# Patient Record
Sex: Male | Born: 1975 | Race: White | Hispanic: No | Marital: Married | Smoking: Never smoker
Health system: Southern US, Community
[De-identification: ages and names within clinical notes are randomized; demographics above are authoritative.]

## PROBLEM LIST (undated history)

## (undated) DIAGNOSIS — R002 Palpitations: Secondary | ICD-10-CM

## (undated) DIAGNOSIS — I493 Ventricular premature depolarization: Secondary | ICD-10-CM

## (undated) DIAGNOSIS — T7840XA Allergy, unspecified, initial encounter: Secondary | ICD-10-CM

## (undated) DIAGNOSIS — E785 Hyperlipidemia, unspecified: Secondary | ICD-10-CM

## (undated) DIAGNOSIS — Z8619 Personal history of other infectious and parasitic diseases: Secondary | ICD-10-CM

## (undated) DIAGNOSIS — B351 Tinea unguium: Secondary | ICD-10-CM

## (undated) DIAGNOSIS — L309 Dermatitis, unspecified: Secondary | ICD-10-CM

## (undated) DIAGNOSIS — E559 Vitamin D deficiency, unspecified: Secondary | ICD-10-CM

## (undated) HISTORY — PX: OTHER SURGICAL HISTORY: SHX169

## (undated) HISTORY — DX: Vitamin D deficiency, unspecified: E55.9

## (undated) HISTORY — DX: Tinea unguium: B35.1

## (undated) HISTORY — DX: Dermatitis, unspecified: L30.9

## (undated) HISTORY — PX: TONSILLECTOMY: SHX5217

## (undated) HISTORY — DX: Hyperlipidemia, unspecified: E78.5

## (undated) HISTORY — DX: Personal history of other infectious and parasitic diseases: Z86.19

## (undated) HISTORY — DX: Ventricular premature depolarization: I49.3

## (undated) HISTORY — DX: Allergy, unspecified, initial encounter: T78.40XA

## (undated) HISTORY — DX: Palpitations: R00.2

---

## 2013-10-13 DIAGNOSIS — R002 Palpitations: Secondary | ICD-10-CM

## 2013-10-13 HISTORY — DX: Palpitations: R00.2

## 2015-08-10 ENCOUNTER — Ambulatory Visit (INDEPENDENT_AMBULATORY_CARE_PROVIDER_SITE_OTHER)
Admission: RE | Admit: 2015-08-10 | Discharge: 2015-08-10 | Disposition: A | Discharge status: Home / Self Care | Payer: 59 | Source: Ambulatory Visit | Attending: Internal Medicine | Admitting: Internal Medicine

## 2015-08-10 ENCOUNTER — Other Ambulatory Visit: Payer: Self-pay | Admitting: Internal Medicine

## 2015-08-10 ENCOUNTER — Encounter: Payer: Self-pay | Admitting: Internal Medicine

## 2015-08-10 DIAGNOSIS — Z9181 History of falling: Secondary | ICD-10-CM

## 2015-08-10 DIAGNOSIS — M25571 Pain in right ankle and joints of right foot: Secondary | ICD-10-CM

## 2015-08-10 IMAGING — DX DG ANKLE COMPLETE 3+V*R*
3 series · 3 of 3 positions shown · non-contrast
Comparison: None.

CLINICAL DATA: Rolled right ankle on [REDACTED] with tenderness.
Initial encounter.

EXAM:
RIGHT ANKLE - COMPLETE 3+ VIEW

[ankle ap]
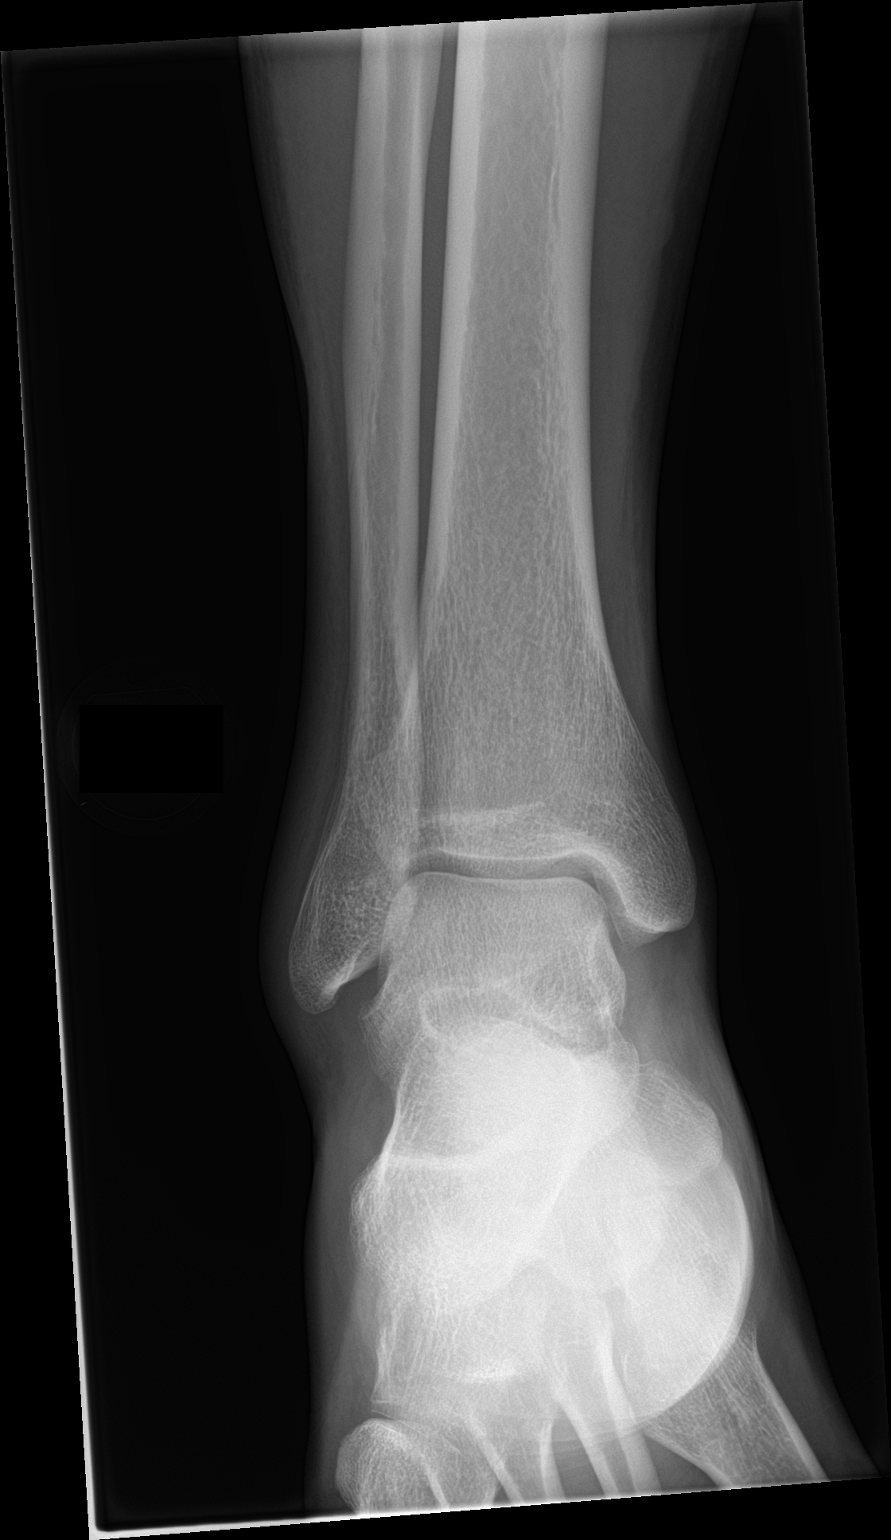

[ankle obl]
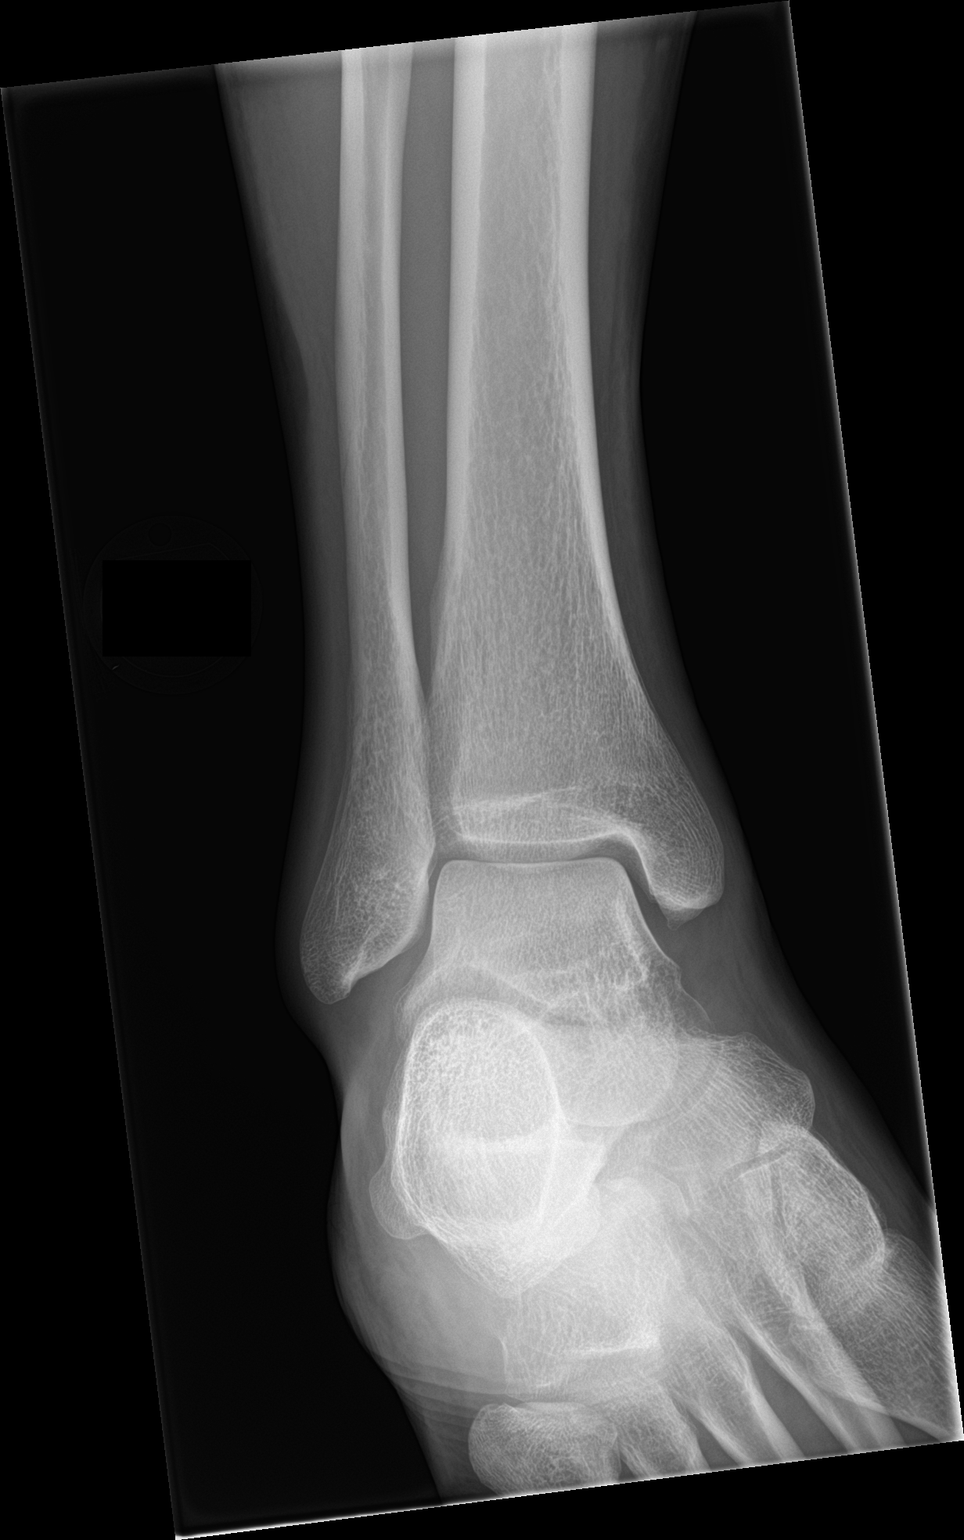

[ankle lat]
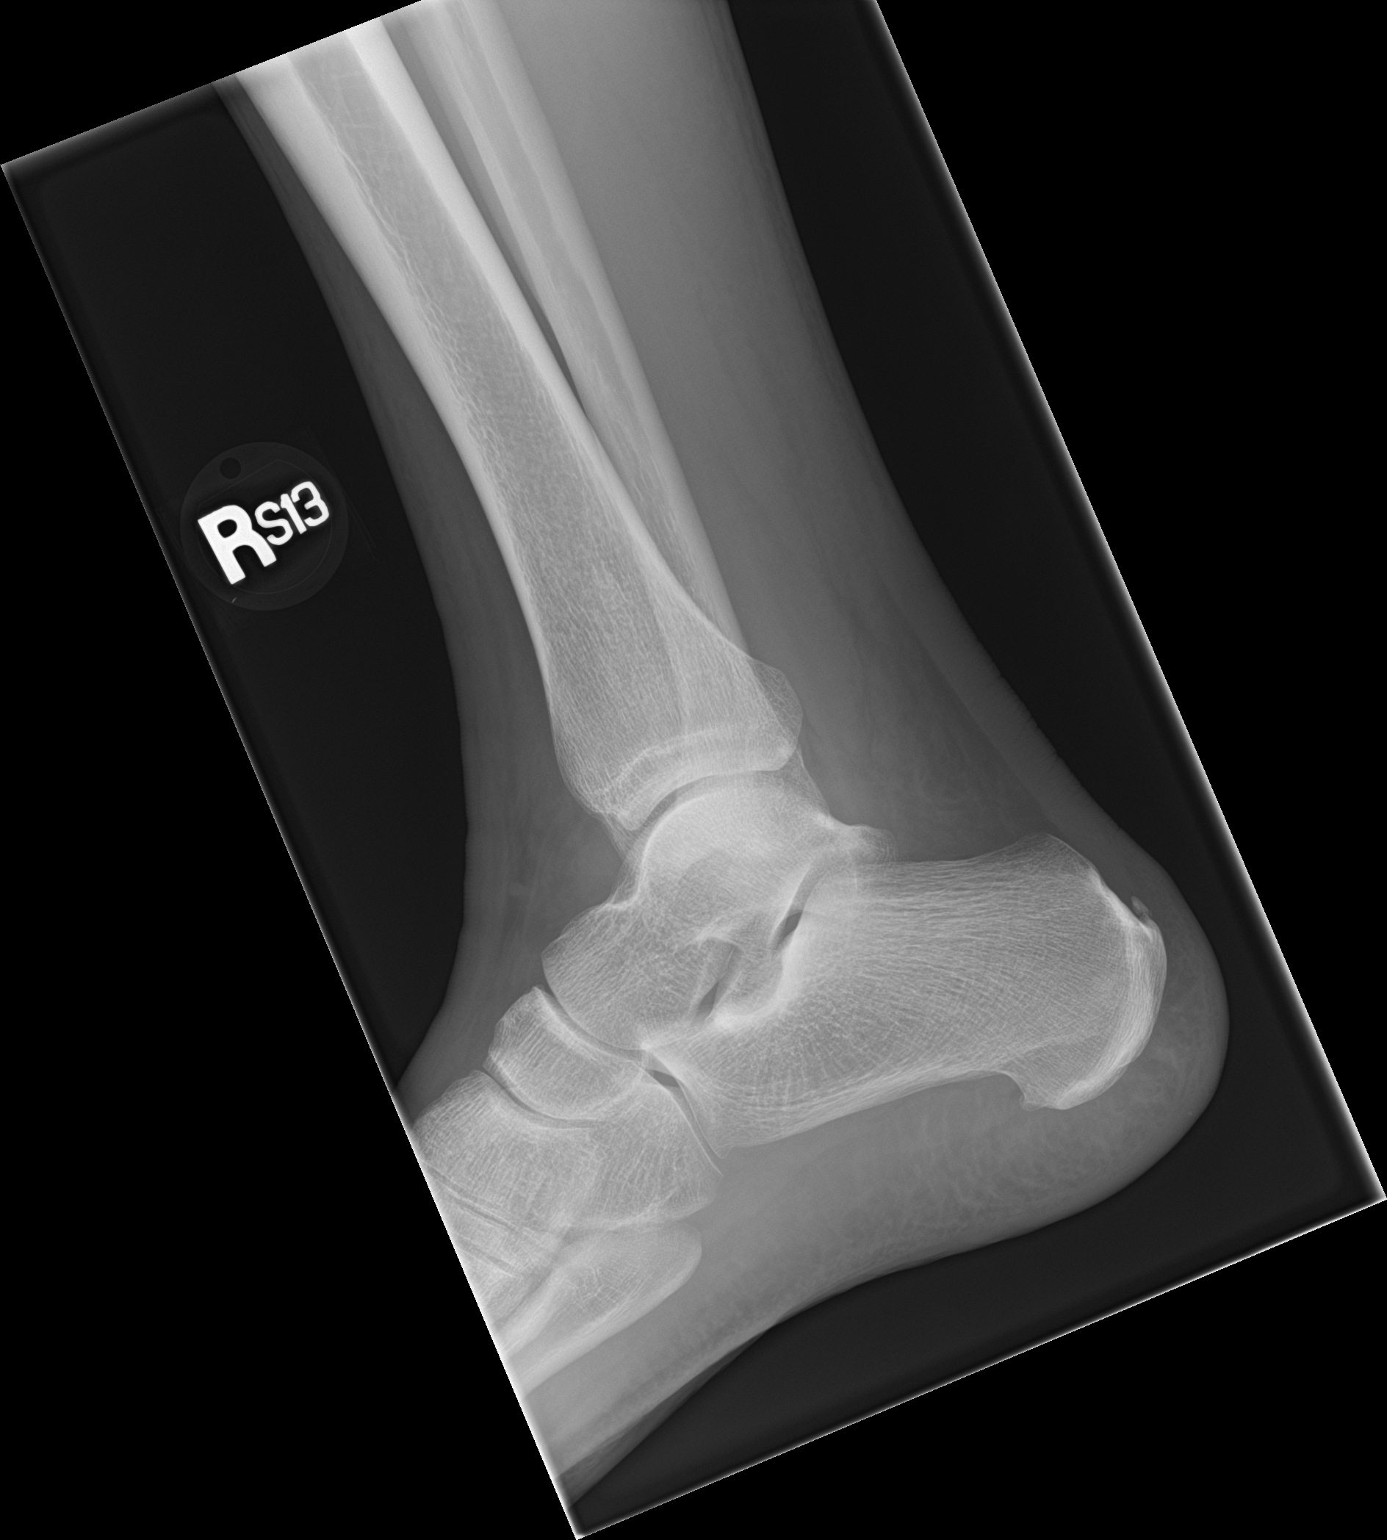

[3 of 3 positions shown; findings below may reference images not displayed]

FINDINGS: Mild periarticular swelling with possible small ankle joint
effusion. No fracture or malalignment.

Small calcaneal spurs.
IMPRESSION: Soft tissue swelling without fracture.

## 2015-08-10 NOTE — Progress Notes (Signed)
Per Dr Vena Rua verbal orders, he would like patient to have right ankle x rays to rule out fracture due to recent fall.

## 2015-08-13 ENCOUNTER — Encounter: Payer: Self-pay | Admitting: Internal Medicine

## 2015-08-13 NOTE — Progress Notes (Signed)
Patient ID: Gary Watts, male   DOB: [birth year], 39 y.o.   MRN: [id number] 39 year old male with little past medical history Twisted ankle coming off of the step 4 days ago. Has had swelling, bruising and pain initially the pain has improved Able to ambulate, some pain with extreme dorsiflexion No fevers Assessment: ankle sprain, rule out fracture Plan: complete ankle xray series, ortho if abnormality found. Otherwise, rest, ice, elevation

## 2016-01-10 DIAGNOSIS — Z0000 Encounter for general adult medical examination without abnormal findings: Secondary | ICD-10-CM | POA: Diagnosis not present

## 2016-01-10 DIAGNOSIS — Z1389 Encounter for screening for other disorder: Secondary | ICD-10-CM | POA: Diagnosis not present

## 2016-01-10 DIAGNOSIS — Z6824 Body mass index (BMI) 24.0-24.9, adult: Secondary | ICD-10-CM | POA: Diagnosis not present

## 2016-01-10 DIAGNOSIS — I493 Ventricular premature depolarization: Secondary | ICD-10-CM | POA: Diagnosis not present

## 2016-01-10 DIAGNOSIS — Z23 Encounter for immunization: Secondary | ICD-10-CM | POA: Diagnosis not present

## 2016-11-03 MED FILL — CARTIA XT 120 MG CAPSULE SA: 120 | 90 days supply | Qty: 90 | Fill #0

## 2017-05-25 DIAGNOSIS — Z0000 Encounter for general adult medical examination without abnormal findings: Secondary | ICD-10-CM | POA: Diagnosis not present

## 2017-05-25 DIAGNOSIS — I493 Ventricular premature depolarization: Secondary | ICD-10-CM | POA: Diagnosis not present

## 2017-05-25 DIAGNOSIS — E559 Vitamin D deficiency, unspecified: Secondary | ICD-10-CM | POA: Diagnosis not present

## 2017-05-25 DIAGNOSIS — Z6822 Body mass index (BMI) 22.0-22.9, adult: Secondary | ICD-10-CM | POA: Diagnosis not present

## 2017-05-25 DIAGNOSIS — R8299 Other abnormal findings in urine: Secondary | ICD-10-CM | POA: Diagnosis not present

## 2017-05-25 DIAGNOSIS — L308 Other specified dermatitis: Secondary | ICD-10-CM | POA: Diagnosis not present

## 2017-05-25 DIAGNOSIS — N390 Urinary tract infection, site not specified: Secondary | ICD-10-CM | POA: Diagnosis not present

## 2017-05-28 ENCOUNTER — Institutional Professional Consult (permissible substitution): Payer: 59 | Admitting: Internal Medicine

## 2017-05-29 MED FILL — TRIAMCINOLONE 0.1% CREAM: 0.1 | 20 days supply | Qty: 80 | Fill #0

## 2017-05-29 MED FILL — CARTIA XT 120 MG CAPSULE SA: 120 | 90 days supply | Qty: 90 | Fill #0

## 2017-06-25 ENCOUNTER — Ambulatory Visit (INDEPENDENT_AMBULATORY_CARE_PROVIDER_SITE_OTHER): Payer: 59 | Admitting: Internal Medicine

## 2017-06-25 ENCOUNTER — Encounter: Payer: Self-pay | Admitting: Internal Medicine

## 2017-06-25 VITALS — BP 112/76 | HR 77 | Ht 74.0 in | Wt 178.0 lb

## 2017-06-25 DIAGNOSIS — I493 Ventricular premature depolarization: Secondary | ICD-10-CM | POA: Diagnosis not present

## 2017-06-25 MED ORDER — DILTIAZEM HCL 30 MG PO TABS: 30.0000 mg | ORAL_TABLET | Freq: Every day | ORAL | 3 refills | Status: DC | PRN

## 2017-06-25 MED FILL — dilTIAZem HCL 30 MG TABS: 30 | 30 days supply | Qty: 30 | Fill #0

## 2017-06-25 NOTE — Progress Notes (Signed)
ELECTROPHYSIOLOGY CONSULT NOTE  Patient ID: Gary Watts, MRN: [id number], DOB/AGE: 1976-10-04 41 y.o. Admit date: (Not on file) Date of Consult: 06/25/2017  Primary Physician: Crist Infante, MD Primary Cardiologist: *new**     Gary Watts is a 41 y.o. male who is being seen today for the evaluation of PVC at the request of MPerini.    HPI Gary Watts is a 41 y.o. 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 They were quite brief. Holter monitoring demonstrated infrequent PVCs echocardiogram was normal. He was started on diltiazem which resulted in the obliteration of his PVCs with subsequent down titration of the frequency of his medication discontinuation. For some period of time they were better. Over the last months, they have recurred. They're most problematic at night.   He has noted no changes in exercise tolerance.  He has restricted his use of caffeine without benefit.   Past Medical History:  Diagnosis Date  . Eczema   . Eczema   . History of chicken pox   . Onychomycosis   . Palpitations 2015  . PVC (premature ventricular contraction)   . Vitamin D deficiency       Surgical History:  Past Surgical History:  Procedure Laterality Date  . TONSILLECTOMY    . WIDSOM TOOTH EXTRACTION       Home Meds: Prior to Admission medications   Medication Sig Start Date End Date Taking? Authorizing Provider  diltiazem (CARDIZEM) 120 MG tablet Take 120 mg by mouth daily.   Yes [provider]  triamcinolone cream (KENALOG) 0.1 % Apply 1 application topically 2 (two) times daily.   Yes [provider]  diltiazem (CARDIZEM) 30 MG tablet Take 1 tablet (30 mg total) by mouth daily as needed (PVCs). 06/25/17   Deboraha Sprang, MD    Allergies: No Known Allergies  Social History    Social History  . Marital status: Married    Spouse name: N/A  . Number of children: N/A  . Years of education: N/A   Occupational History  . Not on file.   Social History Main Topics  . Smoking status: Never Smoker  . Smokeless tobacco: Never Used  . Alcohol use No  . Drug use: No  . Sexual activity: Yes     Comment: MARRIED (JULIA)   Other Topics Concern  . Not on file   Social History Narrative  . No narrative on file     Family History  Problem Relation Age of Onset  . Healthy Mother   . Hypertension Father   . Hyperlipidemia Father   . Dementia Father   . Alzheimer's disease Father   . Kidney Stones Brother   . AAA (abdominal aortic aneurysm) Brother   . Cancer Maternal Grandmother   . Cancer - Colon Maternal Grandfather   . Kidney failure Paternal Grandmother   . Kidney failure Paternal Grandfather   . Healthy Son   . Healthy Son      ROS:  Please see the history of present illness.     All other systems reviewed and negative.    Physical Exam:  Blood pressure 112/76, pulse 77, height 6\' 2"  (1.88 m), weight 178 lb (80.7 kg), SpO2 99 %. General: Well developed, well nourished male in no acute distress. Head: Normocephalic, atraumatic, sclera non-icteric, no xanthomas, nares are  without discharge. EENT: normal  Lymph Nodes:  none Neck: Negative for carotid bruits. JVD not elevated. Back:without scoliosis kyphosis Lungs: Clear bilaterally to auscultation without wheezes, rales, or rhonchi. Breathing is unlabored. Heart: RRR with S1 S2. No  murmur . No rubs, or gallops appreciated. Abdomen: Soft, non-tender, non-distended with normoactive bowel sounds. No hepatomegaly. No rebound/guarding. No obvious abdominal masses. Msk:  Strength and tone appear normal for age. Extremities: No clubbing or cyanosis. No  edema.  Distal pedal pulses are 2+ and equal bilaterally. Skin: Warm and Dry Neuro: Alert and oriented X 3. CN III-XII intact Grossly normal sensory  and motor function . Psych:  Responds to questions appropriately with a normal affect.      Labs: Cardiac Enzymes No results for input(s): CKTOTAL, CKMB, TROPONINI in the last 72 hours. CBC No results found for: WBC, HGB, HCT, MCV, PLT PROTIME: No results for input(s): LABPROT, INR in the last 72 hours. Chemistry No results for input(s): NA, K, CL, CO2, BUN, CREATININE, CALCIUM, PROT, BILITOT, ALKPHOS, ALT, AST, GLUCOSE in the last 168 hours.  Invalid input(s): LABALBU Lipids No results found for: CHOL, HDL, LDLCALC, TRIG BNP No results found for: PROBNP Thyroid Function Tests: No results for input(s): TSH, T4TOTAL, T3FREE, THYROIDAB in the last 72 hours.  Invalid input(s): FREET3 Miscellaneous No results found for: DDIMER  Radiology/Studies:  No results found.  EKG: Sinus rhythm at 70 Intervals 19/08/36  His able medical record was reviewed. ECG was normal.  2015 Echocardiogram normal 2015 for monitor monomorphic PVCs representing 0.1%. Some were associated with compensatory pause and others were interpolated. They appear to have a left bundle branch block morphology with an inferior axis  Assessment and Plan:  PVCs  He has infrequent PVCs but they are quite disruptive. The past the diltiazem was sufficient; more recently, it has been less effective. His biggest problem now is at night. I have suggested that he try taking his diltiazem in the evening. His heart is structurally normal as best as we know electrocardiographically and echocardiographically and symptom wise. In the event that they are persistently disruptive, flecainide might be of some use. If there disruptive and bursts when necessary flecainide might be of value. The frequency based on his Holter monitor is so low I don't think catheter ablation would be prudent or likely productive. If however his symptoms persist, we can re-quantitate his PVC burden with a Holter monitor    Virl Axe

## 2017-06-25 NOTE — Patient Instructions (Addendum)
Medication Instructions:  Your physician has recommended you make the following change in your medication:   You may take diltiazem 30 mg (1 tablet) daily as needed for PVCs   Labwork: None ordered  Testing/Procedures: Your physician has requested that you have an echocardiogram. Echocardiography is a painless test that uses sound waves to create images of your heart. It provides your doctor with information about the size and shape of your heart and how well your heart's chambers and valves are working. This procedure takes approximately one hour. There are no restrictions for this procedure.  Follow-Up: Your physician wants you to follow-up in: 1 year with Dr. Caryl Comes. You will receive a reminder letter in the mail two months in advance. If you don't receive a letter, please call our office to schedule the follow-up appointment.   Any Other Special Instructions Will Be Listed Below (If Applicable).     If you need a refill on your cardiac medications before your next appointment, please call your pharmacy.

## 2017-07-02 ENCOUNTER — Ambulatory Visit (HOSPITAL_COMMUNITY): Payer: 59 | Attending: Cardiology

## 2017-07-02 ENCOUNTER — Other Ambulatory Visit: Payer: Self-pay

## 2017-07-02 DIAGNOSIS — I493 Ventricular premature depolarization: Secondary | ICD-10-CM | POA: Diagnosis not present

## 2017-07-02 DIAGNOSIS — R002 Palpitations: Secondary | ICD-10-CM | POA: Diagnosis not present

## 2017-07-02 LAB — ECHOCARDIOGRAM COMPLETE
E decel time: 278 msec
E/e' ratio: 4.71
FS: 30 % (ref 28–44)
IVS/LV PW RATIO, ED: 1.35
LA ID, A-P, ES: 35 mm
LA diam end sys: 35 mm
LA diam index: 1.69 cm/m2
LA vol A4C: 35 ml
LA vol index: 25.6 mL/m2
LA vol: 53 mL
LV E/e' medial: 4.71
LV E/e'average: 4.71
LV PW d: 8.38 mm — AB (ref 0.6–1.1)
LV e' LATERAL: 17.3 cm/s
LVOT area: 4.15 cm2
LVOT diameter: 23 mm
MV Dec: 278
MV Peak grad: 3 mmHg
MV pk A vel: 42 m/s
MV pk E vel: 81.4 m/s
PV Reg vel dias: 67.9 cm/s
Reg peak vel: 218 cm/s
TDI e' lateral: 17.3
TDI e' medial: 11.6
TR max vel: 218 cm/s

## 2017-07-09 ENCOUNTER — Telehealth: Payer: Self-pay

## 2017-07-09 NOTE — Telephone Encounter (Signed)
lmtcb for echo results.

## 2017-07-13 ENCOUNTER — Telehealth: Payer: Self-pay

## 2017-07-13 NOTE — Telephone Encounter (Signed)
Pt is aware of normal echo results. He states his PVC's have been better and he is feeling them less and less. He stated that Dr. Caryl Comes spoke to him about wearing a holter monitor if they aren't better but he is not at that point yet so he will wait and let us know in a couple months. We are both agreeable to plan. He was very grateful for the call.

## 2017-09-16 MED FILL — CARTIA XT 120 MG CAPSULE SA: 120 | 90 days supply | Qty: 90 | Fill #1

## 2018-01-19 MED FILL — DILTIAZEM 24HR ER 120 MG CA: 120 | 90 days supply | Qty: 90 | Fill #2

## 2018-06-15 ENCOUNTER — Other Ambulatory Visit: Payer: Self-pay | Admitting: Nurse Practitioner

## 2018-06-15 MED ORDER — CIPROFLOXACIN-HYDROCORTISONE 0.2-1 % OT SUSP: 3.0000 [drp] | Freq: Two times a day (BID) | OTIC | 0 refills | Status: DC

## 2018-06-15 MED ORDER — CIPROFLOXACIN-DEXAMETHASONE 0.3-0.1 % OT SUSP: 4.0000 [drp] | Freq: Two times a day (BID) | OTIC | 0 refills | Status: AC

## 2018-06-15 MED FILL — CIPRODEX OTIC SUSPENSION: 0.3-0.1 | 18 days supply | Qty: 8 | Fill #0

## 2018-06-15 NOTE — Progress Notes (Signed)
Reviewed and agree with management plan.  Karrie Fluellen T. Naquisha Whitehair, MD FACG 

## 2018-06-15 NOTE — Progress Notes (Signed)
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 Pain started a few days ago then got acutely worse after cleaning ear with a Q tip yesterday. No vertigo or fevers. He was concerned about a foreign body in the ear. It is the end of the day, too late to get in with PCP. He asked for me to examine his left ear.  Exam:  Left ear canal erythematous with superficial abrasions. Beyond the canal and anteriorally there was some mild erythema and mild edema. Superficial to that there was a tiny (~ 30mm) cystic appearing lesion. Tympanic membrane looked okay, it was shiny / grey. Dr. Fuller Plan also examined his ear and concurs with findings. I will call in Cipro HC gtts to left ear. He will uses 3 gtts in left ear BID for 7 days and follow up with PCP if needed or if ear pain persists.   Rx change. Cipro is $$$ Will go with CiproDex 4 gtts to left ear BID for 7 days.

## 2018-07-05 ENCOUNTER — Ambulatory Visit (INDEPENDENT_AMBULATORY_CARE_PROVIDER_SITE_OTHER): Payer: Self-pay | Admitting: Family Medicine

## 2018-07-05 VITALS — BP 102/72 | HR 66 | Temp 99.3°F | Wt 191.8 lb

## 2018-07-05 DIAGNOSIS — H6982 Other specified disorders of Eustachian tube, left ear: Secondary | ICD-10-CM

## 2018-07-05 NOTE — Progress Notes (Signed)
Gary Watts is a 42 y.o. male who presents today with concerns of left ear pain. He reports one instance of swimming a few weeks ago. He denies any discharge or any accompanying symptoms. He does have a historical pediatric instance of bilateral PE tube placement but denies any issues as an adult with his ears or the need for an ENT specialist.  Review of Systems  Constitutional: Negative for chills, fever and malaise/fatigue.  HENT: Negative for congestion, ear discharge, ear pain, sinus pain and sore throat.   Eyes: Negative.   Respiratory: Negative for cough, sputum production and shortness of breath.   Cardiovascular: Negative.  Negative for chest pain.  Gastrointestinal: Negative for abdominal pain, diarrhea, nausea and vomiting.  Genitourinary: Negative for dysuria, frequency, hematuria and urgency.  Musculoskeletal: Negative for myalgias.  Skin: Negative.   Neurological: Negative for headaches.  Endo/Heme/Allergies: Negative.   Psychiatric/Behavioral: Negative.     O: Vitals:   07/05/18 0814  BP: 102/72  Pulse: 66  Temp: 99.3 F (37.4 C)  SpO2: 97%     Physical Exam  Constitutional: He is oriented to person, place, and time. Vital signs are normal. He appears well-developed and well-nourished. He is active.  Non-toxic appearance. He does not have a sickly appearance.  HENT:  Head: Normocephalic.  Right Ear: Hearing, tympanic membrane, external ear and ear canal normal.  Left Ear: Hearing, tympanic membrane, external ear and ear canal normal.  Nose: Nose normal.  Mouth/Throat: Uvula is midline and oropharynx is clear and moist.  Neck: Normal range of motion. Neck supple.  Cardiovascular: Normal rate, regular rhythm, normal heart sounds and normal pulses.  Pulmonary/Chest: Effort normal and breath sounds normal.  Abdominal: Soft. Bowel sounds are normal.  Musculoskeletal: Normal range of motion.  Lymphadenopathy:       Head (right side): No submental and no  submandibular adenopathy present.       Head (left side): No submental and no submandibular adenopathy present.    He has no cervical adenopathy.  Neurological: He is alert and oriented to person, place, and time.  Psychiatric: He has a normal mood and affect.  Vitals reviewed.  A: 1. Eustachian tube dysfunction, left    P: Discussed exam findings, diagnosis etiology and medication use and indications reviewed with patient. Follow- Up and discharge instructions provided. No emergent/urgent issues found on exam.  Patient verbalized understanding of information provided and agrees with plan of care (POC), all questions answered.  1. Eustachian tube dysfunction, left Advised motrin, discussed attempting to pop ears multiple times a day and seek additional care if not improved in 7 days. Today exam WNL.

## 2018-07-05 NOTE — Patient Instructions (Signed)
PLAN< Trial plug nose, close mouth and blow x 20 times a day  Eustachian Tube Dysfunction The eustachian tube connects the middle ear to the back of the nose. It regulates air pressure in the middle ear by allowing air to move between the ear and nose. It also helps to drain fluid from the middle ear space. When the eustachian tube does not function properly, air pressure, fluid, or both can build up in the middle ear. Eustachian tube dysfunction can affect one or both ears. What are the causes? This condition happens when the eustachian tube becomes blocked or cannot open normally. This may result from:  Ear infections.  Colds and other upper respiratory infections.  Allergies.  Irritation, such as from cigarette smoke or acid from the stomach coming up into the esophagus (gastroesophageal reflux).  Sudden changes in air pressure, such as from descending in an airplane.  Abnormal growths in the nose or throat, such as nasal polyps, tumors, or enlarged tissue at the back of the throat (adenoids).  What increases the risk? This condition may be more likely to develop in people who smoke and people who are overweight. Eustachian tube dysfunction may also be more likely to develop in children, especially children who have:  Certain birth defects of the mouth, such as cleft palate.  Large tonsils and adenoids.  What are the signs or symptoms? Symptoms of this condition may include:  A feeling of fullness in the ear.  Ear pain.  Clicking or popping noises in the ear.  Ringing in the ear.  Hearing loss.  Loss of balance.  Symptoms may get worse when the air pressure around you changes, such as when you travel to an area of high elevation or fly on an airplane. How is this diagnosed? This condition may be diagnosed based on:  Your symptoms.  A physical exam of your ear, nose, and throat.  Tests, such as those that measure: ? The movement of your eardrum  (tympanogram). ? Your hearing (audiometry).  How is this treated? Treatment depends on the cause and severity of your condition. If your symptoms are mild, you may be able to relieve your symptoms by moving air into ("popping") your ears. If you have symptoms of fluid in your ears, treatment may include:  Decongestants.  Antihistamines.  Nasal sprays or ear drops that contain medicines that reduce swelling (steroids).  In some cases, you may need to have a procedure to drain the fluid in your eardrum (myringotomy). In this procedure, a small tube is placed in the eardrum to:  Drain the fluid.  Restore the air in the middle ear space.  Follow these instructions at home:  Take over-the-counter and prescription medicines only as told by your health care provider.  Use techniques to help pop your ears as recommended by your health care provider. These may include: ? Chewing gum. ? Yawning. ? Frequent, forceful swallowing. ? Closing your mouth, holding your nose closed, and gently blowing as if you are trying to blow air out of your nose.  Do not do any of the following until your health care provider approves: ? Travel to high altitudes. ? Fly in airplanes. ? Work in a Pension scheme manager or room. ? Scuba dive.  Keep your ears dry. Dry your ears completely after showering or bathing.  Do not smoke.  Keep all follow-up visits as told by your health care provider. This is important. Contact a health care provider if:  Your symptoms do not  go away after treatment.  Your symptoms come back after treatment.  You are unable to pop your ears.  You have: ? A fever. ? Pain in your ear. ? Pain in your head or neck. ? Fluid draining from your ear.  Your hearing suddenly changes.  You become very dizzy.  You lose your balance. This information is not intended to replace advice given to you by your health care provider. Make sure you discuss any questions you have with your  health care provider. Document Released: 10/26/2015 Document Revised: 03/06/2016 Document Reviewed: 10/18/2014 Elsevier Interactive Patient Education  [employer].

## 2018-07-21 MED FILL — DILTIAZEM 24HR ER 120 MG CA: 120 | 30 days supply | Qty: 30 | Fill #0

## 2018-08-18 DIAGNOSIS — I493 Ventricular premature depolarization: Secondary | ICD-10-CM | POA: Diagnosis not present

## 2018-08-18 DIAGNOSIS — B351 Tinea unguium: Secondary | ICD-10-CM | POA: Diagnosis not present

## 2018-08-18 DIAGNOSIS — Z6825 Body mass index (BMI) 25.0-25.9, adult: Secondary | ICD-10-CM | POA: Diagnosis not present

## 2018-08-18 DIAGNOSIS — E559 Vitamin D deficiency, unspecified: Secondary | ICD-10-CM | POA: Diagnosis not present

## 2018-08-18 DIAGNOSIS — L308 Other specified dermatitis: Secondary | ICD-10-CM | POA: Diagnosis not present

## 2018-08-18 MED FILL — CARTIA XT 120 MG CAPSULE: 120 | 90 days supply | Qty: 90 | Fill #0

## 2018-09-29 ENCOUNTER — Ambulatory Visit: Payer: 59 | Admitting: Internal Medicine

## 2018-09-29 ENCOUNTER — Encounter: Payer: Self-pay | Admitting: Internal Medicine

## 2018-09-29 VITALS — BP 110/78 | HR 56 | Ht 74.0 in | Wt 195.4 lb

## 2018-09-29 DIAGNOSIS — I493 Ventricular premature depolarization: Secondary | ICD-10-CM | POA: Diagnosis not present

## 2018-09-29 NOTE — Patient Instructions (Signed)
Medication Instructions:  Your physician recommends that you continue on your current medications as directed. Please refer to the Current Medication list given to you today.  Labwork: None ordered.  Testing/Procedures: None ordered.  Follow-Up: Your physician recommends that you schedule a follow-up appointment in:   One year with Dr Caryl Comes  Any Other Special Instructions Will Be Listed Below (If Applicable).     If you need a refill on your cardiac medications before your next appointment, please call your pharmacy.

## 2018-09-29 NOTE — Progress Notes (Signed)
      Patient Care Team: Crist Infante, MD as PCP - General (Internal Medicine)   HPI  Gary Watts is a 42 y.o. male Seen in follow-up for PVCs; these have been problematic on and off for 3 or 4 years.  As expected mostly during periods of quiet and going to sleep.  More problematic on the left side.  Has been responsive largely to diltiazem.  He tried to wean himself off with difficulty.  Ended up on every other day and then more recently every day.  While taking it he has less PVCs and PVCs that he has bothered less.  No obvious triggers.  Records and Results Reviewed   Past Medical History:  Diagnosis Date  . Eczema   . Eczema   . History of chicken pox   . Onychomycosis   . Palpitations 2015  . PVC (premature ventricular contraction)   . Vitamin D deficiency     Past Surgical History:  Procedure Laterality Date  . TONSILLECTOMY    . WIDSOM TOOTH EXTRACTION      Current Meds  Medication Sig  . diltiazem (CARDIZEM) 120 MG tablet Take 120 mg by mouth daily.  Marland Kitchen diltiazem (CARDIZEM) 30 MG tablet Take 1 tablet (30 mg total) by mouth daily as needed (PVCs).    Allergies  Allergen Reactions  . Cefaclor Rash      Review of Systems negative except from HPI and PMH  Physical Exam BP 110/78   Pulse (!) 56   Ht 6\' 2"  (1.88 m)   Wt 195 lb 6.4 oz (88.6 kg)   SpO2 97%   BMI 25.09 kg/m  Well developed and well nourished in no acute distress HENT normal E scleral and icterus clear Neck Supple JVP flat; carotids brisk and full Clear to ausculation  Regular rate and rhythm, no murmurs gallops or rub Soft with active bowel sounds No clubbing cyanosis  Edema Alert and oriented, grossly normal motor and sensory function Skin Warm and Dry  ECG sinus 56 18/09/39  Assessment and  Plan  PVCs   Frequency seems sparse; we discussed monitoring or not.  At this point we will continue to allow his symptoms to declare likely frequency.  He would like to continue  his diltiazem daily.  He feels better when he has less PVCs and his PVCs seem to be less problematic when he is taking diltiazem.  Current medicines are reviewed at length with the patient today .  The patient does not  have concerns regarding medicines.

## 2018-12-08 DIAGNOSIS — I861 Scrotal varices: Secondary | ICD-10-CM | POA: Diagnosis not present

## 2018-12-08 DIAGNOSIS — I493 Ventricular premature depolarization: Secondary | ICD-10-CM | POA: Diagnosis not present

## 2018-12-08 DIAGNOSIS — Z6824 Body mass index (BMI) 24.0-24.9, adult: Secondary | ICD-10-CM | POA: Diagnosis not present

## 2018-12-08 DIAGNOSIS — R5383 Other fatigue: Secondary | ICD-10-CM | POA: Diagnosis not present

## 2018-12-08 DIAGNOSIS — R634 Abnormal weight loss: Secondary | ICD-10-CM | POA: Diagnosis not present

## 2018-12-08 DIAGNOSIS — R6882 Decreased libido: Secondary | ICD-10-CM | POA: Diagnosis not present

## 2018-12-23 DIAGNOSIS — N529 Male erectile dysfunction, unspecified: Secondary | ICD-10-CM | POA: Diagnosis not present

## 2018-12-23 DIAGNOSIS — R6882 Decreased libido: Secondary | ICD-10-CM | POA: Diagnosis not present

## 2018-12-27 DIAGNOSIS — N529 Male erectile dysfunction, unspecified: Secondary | ICD-10-CM | POA: Diagnosis not present

## 2019-04-11 DIAGNOSIS — H5213 Myopia, bilateral: Secondary | ICD-10-CM | POA: Diagnosis not present

## 2019-04-11 DIAGNOSIS — H52223 Regular astigmatism, bilateral: Secondary | ICD-10-CM | POA: Diagnosis not present

## 2019-10-19 MED FILL — CARTIA XT 120 MG CAPSULE: 120 | 90 days supply | Qty: 90 | Fill #0

## 2019-11-13 DIAGNOSIS — I493 Ventricular premature depolarization: Secondary | ICD-10-CM | POA: Insufficient documentation

## 2019-11-15 ENCOUNTER — Ambulatory Visit (INDEPENDENT_AMBULATORY_CARE_PROVIDER_SITE_OTHER): Payer: 59 | Admitting: Internal Medicine

## 2019-11-15 ENCOUNTER — Other Ambulatory Visit: Payer: Self-pay

## 2019-11-15 ENCOUNTER — Encounter: Payer: Self-pay | Admitting: Internal Medicine

## 2019-11-15 DIAGNOSIS — I493 Ventricular premature depolarization: Secondary | ICD-10-CM | POA: Diagnosis not present

## 2019-11-15 NOTE — Patient Instructions (Addendum)
Medication Instructions:  Your physician recommends that you continue on your current medications as directed. Please refer to the Current Medication list given to you today.  *If you need a refill on your cardiac medications before your next appointment, please call your pharmacy*  Lab Work: None ordered.  If you have labs (blood work) drawn today and your tests are completely normal, you will receive your results only by: Marland Kitchen MyChart Message (if you have MyChart) OR . A paper copy in the mail If you have any lab test that is abnormal or we need to change your treatment, we will call you to review the results.  Testing/Procedures: None ordered.   Follow-Up: At [facility], you and your health needs are our priority.  As part of our continuing mission to provide you with exceptional heart care, we have created designated Provider Care Teams.  These Care Teams include your primary Cardiologist (physician) and Advanced Practice Providers (APPs -  Physician Assistants and Nurse Practitioners) who all work together to provide you with the care you need, when you need it.  Your next appointment:  12 months with Dr Caryl Comes.

## 2019-11-15 NOTE — Progress Notes (Signed)
      Patient Care Team: Crist Infante, MD as PCP - General (Internal Medicine)   HPI  Gary Watts is a 44 y.o. male Seen in follow-up for PVCs responsive largely to diltiazem.  He tried to wean himself off with difficulty.  Ended up on every other day and then more recently every day.  Came off for 10 months, thought might be contributing to ED, but no change off and PVCs remain quiescient until just a month or 2 ago.  He is back on at home 20 mg daily.  No other obvious untowards     Questions about his lipid; not available.  He will look at his records at home  DATE TEST EF   9/18 Echo   55-60 %           Records and Results Reviewed   Past Medical History:  Diagnosis Date  . Eczema   . Eczema   . History of chicken pox   . Onychomycosis   . Palpitations 2015  . PVC (premature ventricular contraction)   . Vitamin D deficiency     Past Surgical History:  Procedure Laterality Date  . TONSILLECTOMY    . WIDSOM TOOTH EXTRACTION      Current Meds  Medication Sig  . Cholecalciferol (VITAMIN D) 50 MCG (2000 UT) tablet Take 2,000 Units by mouth daily. 2 tablet per day  . diltiazem (CARDIZEM) 120 MG tablet Take 120 mg by mouth daily.  . Multiple Vitamin (MULTIVITAMIN) tablet Take 1 tablet by mouth daily.    Allergies  Allergen Reactions  . Cefaclor Rash      Review of Systems negative except from HPI and PMH  Physical Exam BP 102/72   Pulse (!) 52   Ht 6\' 2"  (1.88 m)   Wt 194 lb 9.6 oz (88.3 kg)   SpO2 98%   BMI 24.99 kg/m  Well developed and nourished in no acute distress HENT normal Neck supple with JVP-  flat  Clear Regular rate and rhythm, no murmurs or gallops Abd-soft with active BS No Clubbing cyanosis edema Skin-warm and dry A & Oriented  Grossly normal sensory and motor function  ECG sinus at 52 Interval 17/39 otherwise normal  Assessment and  Plan  PVCs   Continue diltiazem.  He will go on again and off again as he  desires.  Discussed flecainide as an alternative.  I do not know whether diltiazem could be contributing to the ED or not.  We will have to look this up.  Did not have his lipids available for risk reduction.  He will forward them to me.  We discussed calcium scoring if he is in the intermediate range.

## 2019-11-23 DIAGNOSIS — E559 Vitamin D deficiency, unspecified: Secondary | ICD-10-CM | POA: Diagnosis not present

## 2019-11-23 DIAGNOSIS — J302 Other seasonal allergic rhinitis: Secondary | ICD-10-CM | POA: Diagnosis not present

## 2019-11-23 DIAGNOSIS — Z1339 Encounter for screening examination for other mental health and behavioral disorders: Secondary | ICD-10-CM | POA: Diagnosis not present

## 2019-11-23 DIAGNOSIS — B351 Tinea unguium: Secondary | ICD-10-CM | POA: Diagnosis not present

## 2019-11-23 DIAGNOSIS — Z8371 Family history of colonic polyps: Secondary | ICD-10-CM | POA: Diagnosis not present

## 2019-11-23 DIAGNOSIS — R634 Abnormal weight loss: Secondary | ICD-10-CM | POA: Diagnosis not present

## 2019-11-23 DIAGNOSIS — I493 Ventricular premature depolarization: Secondary | ICD-10-CM | POA: Diagnosis not present

## 2019-11-23 DIAGNOSIS — Z1331 Encounter for screening for depression: Secondary | ICD-10-CM | POA: Diagnosis not present

## 2019-11-23 DIAGNOSIS — Z0000 Encounter for general adult medical examination without abnormal findings: Secondary | ICD-10-CM | POA: Diagnosis not present

## 2019-11-23 MED FILL — TERBINAFINE HCL 250 MG TAB: 250 | 90 days supply | Qty: 90 | Fill #0

## 2019-12-08 ENCOUNTER — Other Ambulatory Visit: Payer: Self-pay | Admitting: Nurse Practitioner

## 2019-12-08 DIAGNOSIS — Z0000 Encounter for general adult medical examination without abnormal findings: Secondary | ICD-10-CM

## 2019-12-14 ENCOUNTER — Other Ambulatory Visit (INDEPENDENT_AMBULATORY_CARE_PROVIDER_SITE_OTHER): Payer: 59

## 2019-12-14 DIAGNOSIS — Z125 Encounter for screening for malignant neoplasm of prostate: Secondary | ICD-10-CM

## 2019-12-14 DIAGNOSIS — Z0000 Encounter for general adult medical examination without abnormal findings: Secondary | ICD-10-CM

## 2019-12-14 LAB — CBC WITH DIFFERENTIAL/PLATELET
Basophils Absolute: 0 10*3/uL (ref 0.0–0.1)
Basophils Relative: 0.9 % (ref 0.0–3.0)
Eosinophils Absolute: 0.1 10*3/uL (ref 0.0–0.7)
Eosinophils Relative: 1.5 % (ref 0.0–5.0)
HCT: 45.1 % (ref 39.0–52.0)
Hemoglobin: 15 g/dL (ref 13.0–17.0)
Lymphocytes Relative: 40.6 % (ref 12.0–46.0)
Lymphs Abs: 1.6 10*3/uL (ref 0.7–4.0)
MCHC: 33.2 g/dL (ref 30.0–36.0)
MCV: 91.7 fl (ref 78.0–100.0)
Monocytes Absolute: 0.3 10*3/uL (ref 0.1–1.0)
Monocytes Relative: 7.2 % (ref 3.0–12.0)
Neutro Abs: 2 10*3/uL (ref 1.4–7.7)
Neutrophils Relative %: 49.8 % (ref 43.0–77.0)
Platelets: 164 10*3/uL (ref 150.0–400.0)
RBC: 4.92 Mil/uL (ref 4.22–5.81)
RDW: 13.1 % (ref 11.5–15.5)
WBC: 3.9 10*3/uL — ABNORMAL LOW (ref 4.0–10.5)

## 2019-12-14 LAB — COMPREHENSIVE METABOLIC PANEL
ALT: 12 U/L (ref 0–53)
AST: 17 U/L (ref 0–37)
Albumin: 4.4 g/dL (ref 3.5–5.2)
Alkaline Phosphatase: 78 U/L (ref 39–117)
BUN: 20 mg/dL (ref 6–23)
CO2: 29 mEq/L (ref 19–32)
Calcium: 9.5 mg/dL (ref 8.4–10.5)
Chloride: 102 mEq/L (ref 96–112)
Creatinine, Ser: 1.11 mg/dL (ref 0.40–1.50)
GFR: 72.51 mL/min (ref >60.00)
Glucose, Bld: 89 mg/dL (ref 70–99)
Potassium: 3.8 mEq/L (ref 3.5–5.1)
Sodium: 139 mEq/L (ref 135–145)
Total Bilirubin: 1.3 mg/dL — ABNORMAL HIGH (ref 0.2–1.2)
Total Protein: 6.9 g/dL (ref 6.0–8.3)

## 2019-12-14 LAB — PSA: PSA: 0.26 ng/mL (ref 0.10–4.00)

## 2019-12-14 LAB — LIPID PANEL
Cholesterol: 197 mg/dL (ref 0–200)
HDL: 45.5 mg/dL (ref >39.00)
LDL Cholesterol: 134 mg/dL — ABNORMAL HIGH (ref 0–99)
NonHDL: 151.68
Total CHOL/HDL Ratio: 4
Triglycerides: 86 mg/dL (ref 0.0–149.0)
VLDL: 17.2 mg/dL (ref 0.0–40.0)

## 2019-12-14 LAB — VITAMIN D 25 HYDROXY (VIT D DEFICIENCY, FRACTURES): VITD: 58.7 ng/mL (ref 30.00–100.00)

## 2019-12-14 LAB — TSH: TSH: 2.76 u[IU]/mL (ref 0.35–4.50)

## 2020-01-23 MED FILL — CARTIA XT 120 MG CP24: 120 | 90 days supply | Qty: 90 | Fill #1

## 2020-04-24 ENCOUNTER — Other Ambulatory Visit (HOSPITAL_COMMUNITY): Payer: Self-pay | Admitting: Internal Medicine

## 2020-04-24 MED FILL — CARTIA XT 120 MG CP24: 120 | 90 days supply | Qty: 90 | Fill #0

## 2020-06-23 ENCOUNTER — Ambulatory Visit: Payer: 59 | Attending: Internal Medicine

## 2020-06-23 ENCOUNTER — Other Ambulatory Visit: Payer: Self-pay

## 2020-06-23 DIAGNOSIS — Z23 Encounter for immunization: Secondary | ICD-10-CM

## 2020-06-23 NOTE — Progress Notes (Signed)
   Covid-19 Vaccination Clinic  Name:  Gary Watts    MRN: [id number] DOB: 11-29-1975  06/23/2020  Gary Watts was observed post Covid-19 immunization for 15 minutes without incident. He was provided with Vaccine Information Sheet and instruction to access the V-Safe system.   Gary Watts was instructed to call 911 with any severe reactions post vaccine: Marland Kitchen Difficulty breathing  . Swelling of face and throat  . A fast heartbeat  . A bad rash all over body  . Dizziness and weakness

## 2020-08-01 MED FILL — CARTIA XT 120 MG CP24: 120 | 90 days supply | Qty: 90 | Fill #1

## 2020-11-13 MED FILL — CARTIA XT 120 MG CP24: 120 | 90 days supply | Qty: 90 | Fill #2

## 2020-11-28 DIAGNOSIS — Z23 Encounter for immunization: Secondary | ICD-10-CM | POA: Diagnosis not present

## 2020-11-28 DIAGNOSIS — E785 Hyperlipidemia, unspecified: Secondary | ICD-10-CM | POA: Diagnosis not present

## 2020-11-28 DIAGNOSIS — I493 Ventricular premature depolarization: Secondary | ICD-10-CM | POA: Diagnosis not present

## 2020-11-28 DIAGNOSIS — B351 Tinea unguium: Secondary | ICD-10-CM | POA: Diagnosis not present

## 2020-11-28 DIAGNOSIS — Z0000 Encounter for general adult medical examination without abnormal findings: Secondary | ICD-10-CM | POA: Diagnosis not present

## 2020-11-28 DIAGNOSIS — E559 Vitamin D deficiency, unspecified: Secondary | ICD-10-CM | POA: Diagnosis not present

## 2020-12-04 ENCOUNTER — Other Ambulatory Visit: Payer: Self-pay | Admitting: Internal Medicine

## 2020-12-04 DIAGNOSIS — E785 Hyperlipidemia, unspecified: Secondary | ICD-10-CM

## 2020-12-19 ENCOUNTER — Ambulatory Visit
Admission: RE | Admit: 2020-12-19 | Discharge: 2020-12-19 | Disposition: A | Discharge status: Home / Self Care | Payer: No Typology Code available for payment source | Source: Ambulatory Visit | Attending: Internal Medicine | Admitting: Internal Medicine

## 2020-12-19 DIAGNOSIS — E785 Hyperlipidemia, unspecified: Secondary | ICD-10-CM

## 2021-01-07 ENCOUNTER — Other Ambulatory Visit: Payer: Self-pay

## 2021-01-07 ENCOUNTER — Other Ambulatory Visit: Payer: Self-pay | Admitting: Physician Assistant

## 2021-01-07 ENCOUNTER — Other Ambulatory Visit (INDEPENDENT_AMBULATORY_CARE_PROVIDER_SITE_OTHER): Payer: 59

## 2021-01-07 DIAGNOSIS — Z125 Encounter for screening for malignant neoplasm of prostate: Secondary | ICD-10-CM

## 2021-01-07 DIAGNOSIS — Z0000 Encounter for general adult medical examination without abnormal findings: Secondary | ICD-10-CM | POA: Diagnosis not present

## 2021-01-07 LAB — CBC WITH DIFFERENTIAL/PLATELET
Basophils Absolute: 0 10*3/uL (ref 0.0–0.1)
Basophils Relative: 0.6 % (ref 0.0–3.0)
Eosinophils Absolute: 0.1 10*3/uL (ref 0.0–0.7)
Eosinophils Relative: 2 % (ref 0.0–5.0)
HCT: 42.6 % (ref 39.0–52.0)
Hemoglobin: 14.6 g/dL (ref 13.0–17.0)
Lymphocytes Relative: 36.2 % (ref 12.0–46.0)
Lymphs Abs: 1.4 10*3/uL (ref 0.7–4.0)
MCHC: 34.2 g/dL (ref 30.0–36.0)
MCV: 89.9 fl (ref 78.0–100.0)
Monocytes Absolute: 0.2 10*3/uL (ref 0.1–1.0)
Monocytes Relative: 6.2 % (ref 3.0–12.0)
Neutro Abs: 2.2 10*3/uL (ref 1.4–7.7)
Neutrophils Relative %: 55 % (ref 43.0–77.0)
Platelets: 166 10*3/uL (ref 150.0–400.0)
RBC: 4.74 Mil/uL (ref 4.22–5.81)
RDW: 12.9 % (ref 11.5–15.5)
WBC: 3.9 10*3/uL — ABNORMAL LOW (ref 4.0–10.5)

## 2021-01-07 LAB — COMPREHENSIVE METABOLIC PANEL
ALT: 11 U/L (ref 0–53)
AST: 15 U/L (ref 0–37)
Albumin: 4.7 g/dL (ref 3.5–5.2)
Alkaline Phosphatase: 75 U/L (ref 39–117)
BUN: 20 mg/dL (ref 6–23)
CO2: 29 mEq/L (ref 19–32)
Calcium: 9.1 mg/dL (ref 8.4–10.5)
Chloride: 103 mEq/L (ref 96–112)
Creatinine, Ser: 1.1 mg/dL (ref 0.40–1.50)
GFR: 82.25 mL/min (ref >60.00)
Glucose, Bld: 98 mg/dL (ref 70–99)
Potassium: 4.2 mEq/L (ref 3.5–5.1)
Sodium: 139 mEq/L (ref 135–145)
Total Bilirubin: 0.9 mg/dL (ref 0.2–1.2)
Total Protein: 6.9 g/dL (ref 6.0–8.3)

## 2021-01-07 LAB — VITAMIN D 25 HYDROXY (VIT D DEFICIENCY, FRACTURES): VITD: 62.01 ng/mL (ref 30.00–100.00)

## 2021-01-07 LAB — PSA: PSA: 0.29 ng/mL (ref 0.10–4.00)

## 2021-01-07 LAB — TSH: TSH: 2.22 u[IU]/mL (ref 0.35–4.50)

## 2021-01-08 LAB — LIPID PANEL WITH LDL/HDL RATIO
Cholesterol, Total: 205 mg/dL — ABNORMAL HIGH (ref 100–199)
HDL: 50 mg/dL (ref >39)
LDL Chol Calc (NIH): 139 mg/dL — ABNORMAL HIGH (ref 0–99)
LDL/HDL Ratio: 2.8 ratio (ref 0.0–3.6)
Triglycerides: 91 mg/dL (ref 0–149)
VLDL Cholesterol Cal: 16 mg/dL (ref 5–40)

## 2021-02-26 ENCOUNTER — Other Ambulatory Visit: Payer: Self-pay | Admitting: Physician Assistant

## 2021-02-26 ENCOUNTER — Other Ambulatory Visit (INDEPENDENT_AMBULATORY_CARE_PROVIDER_SITE_OTHER): Payer: 59

## 2021-02-26 DIAGNOSIS — Z0000 Encounter for general adult medical examination without abnormal findings: Secondary | ICD-10-CM | POA: Diagnosis not present

## 2021-02-26 LAB — URINALYSIS
Bilirubin Urine: NEGATIVE
Hgb urine dipstick: NEGATIVE
Ketones, ur: NEGATIVE
Leukocytes,Ua: NEGATIVE
Nitrite: NEGATIVE
Specific Gravity, Urine: 1.015 (ref 1.000–1.030)
Total Protein, Urine: NEGATIVE
Urine Glucose: NEGATIVE
Urobilinogen, UA: 0.2 (ref 0.0–1.0)
pH: 5.5 (ref 5.0–8.0)

## 2021-03-12 ENCOUNTER — Other Ambulatory Visit (HOSPITAL_COMMUNITY): Payer: Self-pay

## 2021-03-12 MED FILL — Diltiazem HCl Coated Beads Cap ER 24HR 120 MG: ORAL | 90 days supply | Qty: 90 | Fill #0 | Status: AC

## 2021-03-13 ENCOUNTER — Other Ambulatory Visit (HOSPITAL_COMMUNITY): Payer: Self-pay

## 2021-04-02 ENCOUNTER — Other Ambulatory Visit (HOSPITAL_COMMUNITY): Payer: Self-pay

## 2021-04-02 MED ORDER — DOXYCYCLINE HYCLATE 100 MG PO TABS
100.0000 mg | ORAL_TABLET | Freq: Two times a day (BID) | ORAL | 0 refills | Status: DC
  Filled 2021-04-02: qty 14, 7d supply, fill #0

## 2021-04-05 ENCOUNTER — Other Ambulatory Visit (HOSPITAL_COMMUNITY): Payer: Self-pay

## 2021-04-05 MED ORDER — AMOXICILLIN 500 MG PO CAPS
500.0000 mg | ORAL_CAPSULE | Freq: Three times a day (TID) | ORAL | 0 refills | Status: AC
  Filled 2021-04-05: qty 30, 10d supply, fill #0

## 2021-04-30 ENCOUNTER — Other Ambulatory Visit (HOSPITAL_COMMUNITY): Payer: Self-pay

## 2021-04-30 MED ORDER — ONDANSETRON HCL 4 MG PO TABS
4.0000 mg | ORAL_TABLET | Freq: Four times a day (QID) | ORAL | 0 refills | Status: DC | PRN
  Filled 2021-04-30: qty 15, 4d supply, fill #0

## 2021-04-30 MED ORDER — AZITHROMYCIN 500 MG PO TABS
ORAL_TABLET | ORAL | 0 refills | Status: DC
  Filled 2021-04-30: qty 4, 3d supply, fill #0

## 2021-04-30 MED ORDER — ATOVAQUONE-PROGUANIL HCL 250-100 MG PO TABS
ORAL_TABLET | ORAL | 0 refills | Status: DC
  Filled 2021-04-30: qty 24, 24d supply, fill #0

## 2021-07-25 ENCOUNTER — Other Ambulatory Visit (HOSPITAL_COMMUNITY): Payer: Self-pay

## 2021-07-25 MED ORDER — DILTIAZEM HCL ER COATED BEADS 120 MG PO CP24
120.0000 mg | ORAL_CAPSULE | Freq: Every day | ORAL | 3 refills | Status: DC
  Filled 2021-07-25: qty 90, 90d supply, fill #0
  Filled 2021-12-17: qty 90, 90d supply, fill #1

## 2021-11-03 DIAGNOSIS — S63632A Sprain of interphalangeal joint of right middle finger, initial encounter: Secondary | ICD-10-CM | POA: Diagnosis not present

## 2021-12-04 DIAGNOSIS — S63632D Sprain of interphalangeal joint of right middle finger, subsequent encounter: Secondary | ICD-10-CM | POA: Diagnosis not present

## 2021-12-10 ENCOUNTER — Other Ambulatory Visit (HOSPITAL_COMMUNITY): Payer: Self-pay

## 2021-12-10 MED ORDER — PREDNISONE 10 MG (21) PO TBPK
ORAL_TABLET | ORAL | 0 refills | Status: AC
  Filled 2021-12-10: qty 21, 6d supply, fill #0

## 2021-12-10 MED ORDER — TRIAMCINOLONE ACETONIDE 0.1 % EX CREA
1.0000 "application " | TOPICAL_CREAM | Freq: Three times a day (TID) | CUTANEOUS | 2 refills | Status: DC
  Filled 2021-12-10: qty 60, 30d supply, fill #0

## 2021-12-13 DIAGNOSIS — L929 Granulomatous disorder of the skin and subcutaneous tissue, unspecified: Secondary | ICD-10-CM | POA: Diagnosis not present

## 2021-12-13 DIAGNOSIS — D485 Neoplasm of uncertain behavior of skin: Secondary | ICD-10-CM | POA: Diagnosis not present

## 2021-12-13 DIAGNOSIS — R21 Rash and other nonspecific skin eruption: Secondary | ICD-10-CM | POA: Diagnosis not present

## 2021-12-13 DIAGNOSIS — L817 Pigmented purpuric dermatosis: Secondary | ICD-10-CM | POA: Diagnosis not present

## 2021-12-17 ENCOUNTER — Other Ambulatory Visit (HOSPITAL_COMMUNITY): Payer: Self-pay

## 2022-05-15 ENCOUNTER — Other Ambulatory Visit (HOSPITAL_COMMUNITY): Payer: Self-pay

## 2022-05-15 DIAGNOSIS — I493 Ventricular premature depolarization: Secondary | ICD-10-CM | POA: Diagnosis not present

## 2022-05-15 DIAGNOSIS — Z1389 Encounter for screening for other disorder: Secondary | ICD-10-CM | POA: Diagnosis not present

## 2022-05-15 DIAGNOSIS — Z23 Encounter for immunization: Secondary | ICD-10-CM | POA: Diagnosis not present

## 2022-05-15 DIAGNOSIS — R7309 Other abnormal glucose: Secondary | ICD-10-CM | POA: Diagnosis not present

## 2022-05-15 DIAGNOSIS — G43109 Migraine with aura, not intractable, without status migrainosus: Secondary | ICD-10-CM | POA: Diagnosis not present

## 2022-05-15 DIAGNOSIS — Z8371 Family history of colonic polyps: Secondary | ICD-10-CM | POA: Diagnosis not present

## 2022-05-15 DIAGNOSIS — Z1331 Encounter for screening for depression: Secondary | ICD-10-CM | POA: Diagnosis not present

## 2022-05-15 DIAGNOSIS — E559 Vitamin D deficiency, unspecified: Secondary | ICD-10-CM | POA: Diagnosis not present

## 2022-05-15 DIAGNOSIS — Z8249 Family history of ischemic heart disease and other diseases of the circulatory system: Secondary | ICD-10-CM | POA: Diagnosis not present

## 2022-05-15 DIAGNOSIS — E785 Hyperlipidemia, unspecified: Secondary | ICD-10-CM | POA: Diagnosis not present

## 2022-05-15 DIAGNOSIS — Z0000 Encounter for general adult medical examination without abnormal findings: Secondary | ICD-10-CM | POA: Diagnosis not present

## 2022-05-15 MED ORDER — DILTIAZEM HCL ER COATED BEADS 120 MG PO CP24
120.0000 mg | ORAL_CAPSULE | Freq: Every day | ORAL | 3 refills | Status: AC
  Filled 2022-05-15: qty 90, 90d supply, fill #0

## 2022-05-20 ENCOUNTER — Other Ambulatory Visit (HOSPITAL_COMMUNITY): Payer: Self-pay

## 2022-05-20 MED ORDER — ROSUVASTATIN CALCIUM 10 MG PO TABS
10.0000 mg | ORAL_TABLET | Freq: Every day | ORAL | 3 refills | Status: AC
  Filled 2022-05-20: qty 90, 90d supply, fill #0
  Filled 2022-08-28: qty 90, 90d supply, fill #1
  Filled 2022-12-02: qty 90, 90d supply, fill #2
  Filled 2023-03-03: qty 90, 90d supply, fill #3

## 2022-05-22 ENCOUNTER — Other Ambulatory Visit (HOSPITAL_COMMUNITY): Payer: Self-pay

## 2022-05-22 MED ORDER — ROSUVASTATIN CALCIUM 10 MG PO TABS
10.0000 mg | ORAL_TABLET | Freq: Every day | ORAL | 3 refills | Status: DC
  Filled 2022-05-22: qty 90, 90d supply, fill #0

## 2022-05-23 ENCOUNTER — Other Ambulatory Visit: Payer: Self-pay | Admitting: Internal Medicine

## 2022-05-26 ENCOUNTER — Other Ambulatory Visit (HOSPITAL_COMMUNITY): Payer: Self-pay

## 2022-06-12 ENCOUNTER — Other Ambulatory Visit (HOSPITAL_COMMUNITY): Payer: Self-pay

## 2022-06-12 MED ORDER — ZOSTER VAC RECOMB ADJUVANTED 50 MCG/0.5ML IM SUSR
INTRAMUSCULAR | 1 refills | Status: DC
  Filled 2022-06-12 – 2022-06-24 (×2): qty 1, 1d supply, fill #0

## 2022-06-24 ENCOUNTER — Other Ambulatory Visit (HOSPITAL_COMMUNITY): Payer: Self-pay

## 2022-07-04 ENCOUNTER — Other Ambulatory Visit (HOSPITAL_COMMUNITY): Payer: Self-pay

## 2022-07-04 DIAGNOSIS — Z1211 Encounter for screening for malignant neoplasm of colon: Secondary | ICD-10-CM

## 2022-07-04 MED ORDER — PLENVU 140 G PO SOLR
ORAL | 0 refills | Status: AC
  Filled 2022-07-04: qty 3, 1d supply, fill #0

## 2022-07-15 ENCOUNTER — Other Ambulatory Visit (HOSPITAL_COMMUNITY): Payer: Self-pay

## 2022-07-30 DIAGNOSIS — B351 Tinea unguium: Secondary | ICD-10-CM | POA: Diagnosis not present

## 2022-07-30 DIAGNOSIS — L738 Other specified follicular disorders: Secondary | ICD-10-CM | POA: Diagnosis not present

## 2022-07-30 DIAGNOSIS — B353 Tinea pedis: Secondary | ICD-10-CM | POA: Diagnosis not present

## 2022-07-31 ENCOUNTER — Other Ambulatory Visit (HOSPITAL_COMMUNITY): Payer: Self-pay

## 2022-07-31 MED ORDER — FLUCONAZOLE 150 MG PO TABS
150.0000 mg | ORAL_TABLET | ORAL | 5 refills | Status: AC
  Filled 2022-07-31: qty 4, 28d supply, fill #0
  Filled 2022-08-28: qty 4, 28d supply, fill #1
  Filled 2022-09-23: qty 4, 28d supply, fill #2
  Filled 2022-10-20: qty 4, 28d supply, fill #3
  Filled 2022-11-17: qty 4, 28d supply, fill #4
  Filled 2022-12-15: qty 4, 28d supply, fill #5

## 2022-08-21 ENCOUNTER — Ambulatory Visit (AMBULATORY_SURGERY_CENTER): Payer: 59 | Admitting: Gastroenterology

## 2022-08-21 ENCOUNTER — Encounter: Payer: Self-pay | Admitting: Gastroenterology

## 2022-08-21 VITALS — BP 102/69 | HR 60 | Temp 98.7°F | Resp 14 | Ht 74.0 in | Wt 168.2 lb

## 2022-08-21 DIAGNOSIS — Z83719 Family history of colon polyps, unspecified: Secondary | ICD-10-CM

## 2022-08-21 DIAGNOSIS — Z1211 Encounter for screening for malignant neoplasm of colon: Secondary | ICD-10-CM | POA: Diagnosis not present

## 2022-08-21 DIAGNOSIS — I493 Ventricular premature depolarization: Secondary | ICD-10-CM | POA: Diagnosis not present

## 2022-08-21 MED ORDER — SODIUM CHLORIDE 0.9 % IV SOLN: 500.0000 mL | Freq: Once | INTRAVENOUS | Status: DC

## 2022-08-21 NOTE — Progress Notes (Signed)
[facility] History and Physical   Primary Care Physician:  Crist Infante, MD   Reason for Procedure:   CRC screening/ FH polyps (brother, mom). FH CRC in 2nd degree relative.  Plan:    colon     HPI: Gary Watts is a 46 y.o. male  No nausea, vomiting, heartburn, regurgitation, odynophagia or dysphagia.  No significant diarrhea or constipation.  No melena or hematochezia. No unintentional weight loss. No abdominal pain.   Past Medical History:  Diagnosis Date   Allergy    Eczema    Eczema    History of chicken pox    Hyperlipidemia    Onychomycosis    Palpitations 2015   PVC (premature ventricular contraction)    Vitamin D deficiency     Past Surgical History:  Procedure Laterality Date   TONSILLECTOMY     WIDSOM TOOTH EXTRACTION      Prior to Admission medications   Medication Sig Start Date End Date Taking? Authorizing Provider  Cholecalciferol (VITAMIN D) 50 MCG (2000 UT) tablet Take 2,000 Units by mouth daily. 2 tablet per day   Yes [provider]  fexofenadine (ALLEGRA) 180 MG tablet Take 180 mg by mouth daily. PRN   Yes [provider]  fluconazole (DIFLUCAN) 150 MG tablet Take 1 tablet (150 mg total) by mouth once a week. 07/31/22  Yes   PEG-KCl-NaCl-NaSulf-Na Asc-C (PLENVU) 140 g SOLR Use as directed 07/04/22  Yes Jackquline Denmark, MD  rosuvastatin (CRESTOR) 10 MG tablet Take 1 tablet (10 mg total) by mouth daily. 05/20/22  Yes   diltiazem (CARDIZEM CD) 120 MG 24 hr capsule TAKE 1 CAPSULE BY MOUTH ONCE A DAY 04/24/20 06/10/21  Crist Infante, MD  diltiazem (CARDIZEM) 120 MG tablet Take 120 mg by mouth daily.    [provider]  diltiazem (CARTIA XT) 120 MG 24 hr capsule Take 1 capsule (120 mg total) by mouth daily. 05/15/22     Multiple Vitamin (MULTIVITAMIN) tablet Take 1 tablet by mouth daily. Patient not taking: Reported on 08/21/2022    [provider]    Current Outpatient Medications  Medication Sig Dispense  Refill   Cholecalciferol (VITAMIN D) 50 MCG (2000 UT) tablet Take 2,000 Units by mouth daily. 2 tablet per day     fexofenadine (ALLEGRA) 180 MG tablet Take 180 mg by mouth daily. PRN     fluconazole (DIFLUCAN) 150 MG tablet Take 1 tablet (150 mg total) by mouth once a week. 4 tablet 5   PEG-KCl-NaCl-NaSulf-Na Asc-C (PLENVU) 140 g SOLR Use as directed 3 each 0   rosuvastatin (CRESTOR) 10 MG tablet Take 1 tablet (10 mg total) by mouth daily. 90 tablet 3   diltiazem (CARDIZEM CD) 120 MG 24 hr capsule TAKE 1 CAPSULE BY MOUTH ONCE A DAY 90 capsule 3   diltiazem (CARDIZEM) 120 MG tablet Take 120 mg by mouth daily.     diltiazem (CARTIA XT) 120 MG 24 hr capsule Take 1 capsule (120 mg total) by mouth daily. 90 capsule 3   Multiple Vitamin (MULTIVITAMIN) tablet Take 1 tablet by mouth daily. (Patient not taking: Reported on 08/21/2022)     Current Facility-Administered Medications  Medication Dose Route Frequency Provider Last Rate Last Admin   0.9 %  sodium chloride infusion  500 mL Intravenous Once Jackquline Denmark, MD        Allergies as of 08/21/2022 - Review Complete 08/21/2022  Allergen Reaction Noted   Cefaclor Rash 05/03/2015    Family History  Problem  Relation Age of Onset   Colon polyps Mother    Healthy Mother    Colon polyps Father    Hypertension Father    Hyperlipidemia Father    Dementia Father    Alzheimer's disease Father    Colon polyps Brother    Kidney Stones Brother    AAA (abdominal aortic aneurysm) Brother    Colon cancer Maternal Grandmother    Cancer Maternal Grandmother    Cancer - Colon Maternal Grandfather    Kidney failure Paternal Grandmother    Kidney failure Paternal Grandfather    Healthy Son    Healthy Son    Esophageal cancer Neg Hx    Stomach cancer Neg Hx    Rectal cancer Neg Hx     Social History   Socioeconomic History   Marital status: Married    Spouse name: Not on file   Number of children: Not on file   Years of education: Not on file    Highest education level: Not on file  Occupational History   Not on file  Tobacco Use   Smoking status: Never   Smokeless tobacco: Never  Vaping Use   Vaping Use: Never used  Substance and Sexual Activity   Alcohol use: Yes    Comment: socially   Drug use: No   Sexual activity: Yes    Comment: MARRIED (JULIA)  Other Topics Concern   Not on file  Social History Narrative   Not on file   Social Determinants of Health   Financial Resource Strain: Not on file  Food Insecurity: Not on file  Transportation Needs: Not on file  Physical Activity: Not on file  Stress: Not on file  Social Connections: Not on file  Intimate Partner Violence: Not on file    Review of Systems: Positive for none All other review of systems negative except as mentioned in the HPI.  Physical Exam:   General:   Alert,  Well-developed, well-nourished, pleasant and cooperative in NAD Lungs:  Clear throughout to auscultation.   Heart:  Regular rate and rhythm; no murmurs, clicks, rubs,  or gallops. Abdomen:  Soft, nontender and nondistended. Normal bowel sounds.   Neuro/Psych:  Alert and cooperative. Normal mood and affect. A and O x 3    No significant changes were identified.  The patient continues to be an appropriate candidate for the planned procedure and anesthesia.   Carmell Austria, MD. [facility] 08/21/2022 7:42 AM@

## 2022-08-21 NOTE — Patient Instructions (Signed)
YOU HAD AN ENDOSCOPIC PROCEDURE TODAY AT THE [facility]:   Refer to the procedure report that was given to you for any specific questions about what was found during the examination.  If the procedure report does not answer your questions, please call your gastroenterologist to clarify.  If you requested that your care partner not be given the details of your procedure findings, then the procedure report has been included in a sealed envelope for you to review at your convenience later.  YOU SHOULD EXPECT: Some feelings of bloating in the abdomen. Passage of more gas than usual.  Walking can help get rid of the air that was put into your GI tract during the procedure and reduce the bloating. If you had a lower endoscopy (such as a colonoscopy or flexible sigmoidoscopy) you may notice spotting of blood in your stool or on the toilet paper. If you underwent a bowel prep for your procedure, you may not have a normal bowel movement for a few days.  Please Note:  You might notice some irritation and congestion in your nose or some drainage.  This is from the oxygen used during your procedure.  There is no need for concern and it should clear up in a day or so.  SYMPTOMS TO REPORT IMMEDIATELY:  Following lower endoscopy (colonoscopy or flexible sigmoidoscopy):  Excessive amounts of blood in the stool  Significant tenderness or worsening of abdominal pains  Swelling of the abdomen that is new, acute  Fever of 100F or higher  For urgent or emergent issues, a gastroenterologist can be reached at any hour by calling [phone]. Do not use MyChart messaging for urgent concerns.    DIET:  We do recommend a small meal at first, but then you may proceed to your regular diet.  Drink plenty of fluids but you should avoid alcoholic beverages for 24 hours.  ACTIVITY:  You should plan to take it easy for the rest of today and you should NOT DRIVE or use heavy machinery until tomorrow (because of  the sedation medicines used during the test).    FOLLOW UP: Our staff will call the number listed on your records the next business day following your procedure.  We will call around 7:15- 8:00 am to check on you and address any questions or concerns that you may have regarding the information given to you following your procedure. If we do not reach you, we will leave a message.     If any biopsies were taken you will be contacted by phone or by letter within the next 1-3 weeks.  Please call us at [phone] if you have not heard about the biopsies in 3 weeks.    SIGNATURES/CONFIDENTIALITY: You and/or your care partner have signed paperwork which will be entered into your electronic medical record.  These signatures attest to the fact that that the information above on your After Visit Summary has been reviewed and is understood.  Full responsibility of the confidentiality of this discharge information lies with you and/or your care-partner.  

## 2022-08-21 NOTE — Op Note (Addendum)
[facility] Patient Name: Gary Watts Procedure Date: 08/21/2022 7:24 AM MRN: [id number] Endoscopist: Jackquline Denmark , MD, [id number] Age: 46 Referring MD:  Date of Birth: [birth year] Gender: Male Account #: [id number] Procedure:                Colonoscopy Indications:              Screening for colorectal malignant neoplasm. FH                            polyps (mom and brother) Medicines:                Monitored Anesthesia Care Procedure:                Pre-Anesthesia Assessment:                           - Prior to the procedure, a History and Physical                            was performed, and patient medications and                            allergies were reviewed. The patient's tolerance of                            previous anesthesia was also reviewed. The risks                            and benefits of the procedure and the sedation                            options and risks were discussed with the patient.                            All questions were answered, and informed consent                            was obtained. Prior Anticoagulants: The patient has                            taken no anticoagulant or antiplatelet agents. ASA                            Grade Assessment: I - A normal, healthy patient.                            After reviewing the risks and benefits, the patient                            was deemed in satisfactory condition to undergo the                            procedure.  After obtaining informed consent, the colonoscope                            was passed under direct vision. Throughout the                            procedure, the patient's blood pressure, pulse, and                            oxygen saturations were monitored continuously. The                            Olympus PCF-H190DL (#[id number]) Colonoscope was                            introduced through the anus and advanced to the 2                             cm into the ileum. The colonoscopy was performed                            without difficulty. The patient tolerated the                            procedure well. The quality of the bowel                            preparation was excellent. The terminal ileum,                            ileocecal valve, appendiceal orifice, and rectum                            were photographed. Scope In: 4:13:24 AM Scope Out: 8:17:00 AM Scope Withdrawal Time: 0 hours 14 minutes 48 seconds  Total Procedure Duration: 0 hours 20 minutes 12 seconds  Findings:                 The colon (entire examined portion) appeared normal.                           Non-bleeding internal hemorrhoids were found during                            retroflexion. The hemorrhoids were small and Grade                            I (internal hemorrhoids that do not prolapse).                           The terminal ileum appeared normal.                           The exam was otherwise without abnormality on  direct and retroflexion views. Complications:            No immediate complications. Estimated Blood Loss:     Estimated blood loss: none. Impression:               - The entire examined colon is normal.                           - Non-bleeding internal hemorrhoids.                           - The examined portion of the ileum was normal.                           - The examination was otherwise normal on direct                            and retroflexion views.                           - No specimens collected. Recommendation:           - Patient has a contact number available for                            emergencies. The signs and symptoms of potential                            delayed complications were discussed with the                            patient. Return to normal activities tomorrow.                            Written discharge instructions were  provided to the                            patient.                           - Resume previous diet.                           - Continue present medications.                           - Repeat colonoscopy in 5 years for screening                            purposes. Earlier, if with any new problems or                            change in family history.                           - The findings and recommendations were discussed  with the patient's family. Jackquline Denmark, MD 08/21/2022 8:23:31 AM This report has been signed electronically.

## 2022-08-21 NOTE — Progress Notes (Signed)
Cell phone off per pt  

## 2022-08-21 NOTE — Progress Notes (Signed)
Sedate, gd SR, tolerated procedure well, VSS, report to RN 

## 2022-08-29 ENCOUNTER — Other Ambulatory Visit (HOSPITAL_COMMUNITY): Payer: Self-pay

## 2022-09-26 ENCOUNTER — Other Ambulatory Visit: Payer: Self-pay | Admitting: Physician Assistant

## 2022-09-26 DIAGNOSIS — E785 Hyperlipidemia, unspecified: Secondary | ICD-10-CM

## 2022-10-02 ENCOUNTER — Other Ambulatory Visit (INDEPENDENT_AMBULATORY_CARE_PROVIDER_SITE_OTHER): Payer: 59

## 2022-10-02 DIAGNOSIS — E785 Hyperlipidemia, unspecified: Secondary | ICD-10-CM | POA: Diagnosis not present

## 2022-10-02 LAB — COMPREHENSIVE METABOLIC PANEL
ALT: 18 U/L (ref 0–53)
AST: 20 U/L (ref 0–37)
Albumin: 4.3 g/dL (ref 3.5–5.2)
Alkaline Phosphatase: 59 U/L (ref 39–117)
BUN: 23 mg/dL (ref 6–23)
CO2: 28 mEq/L (ref 19–32)
Calcium: 9.3 mg/dL (ref 8.4–10.5)
Chloride: 104 mEq/L (ref 96–112)
Creatinine, Ser: 1.16 mg/dL (ref 0.40–1.50)
GFR: 76.24 mL/min (ref >60.00)
Glucose, Bld: 92 mg/dL (ref 70–99)
Potassium: 3.9 mEq/L (ref 3.5–5.1)
Sodium: 139 mEq/L (ref 135–145)
Total Bilirubin: 0.9 mg/dL (ref 0.2–1.2)
Total Protein: 6.9 g/dL (ref 6.0–8.3)

## 2022-10-02 LAB — HEMOGLOBIN A1C: Hgb A1c MFr Bld: 5.8 % (ref 4.6–6.5)

## 2022-10-03 LAB — LIPID PANEL WITH LDL/HDL RATIO
Cholesterol, Total: 154 mg/dL (ref 100–199)
HDL: 52 mg/dL (ref >39)
LDL Chol Calc (NIH): 90 mg/dL (ref 0–99)
LDL/HDL Ratio: 1.7 ratio (ref 0.0–3.6)
Triglycerides: 59 mg/dL (ref 0–149)
VLDL Cholesterol Cal: 12 mg/dL (ref 5–40)

## 2022-12-01 DIAGNOSIS — M25562 Pain in left knee: Secondary | ICD-10-CM | POA: Diagnosis not present

## 2022-12-01 DIAGNOSIS — M79674 Pain in right toe(s): Secondary | ICD-10-CM | POA: Diagnosis not present

## 2023-03-04 ENCOUNTER — Other Ambulatory Visit: Payer: Self-pay

## 2023-06-09 ENCOUNTER — Other Ambulatory Visit (HOSPITAL_COMMUNITY): Payer: Self-pay

## 2023-06-09 DIAGNOSIS — R600 Localized edema: Secondary | ICD-10-CM | POA: Diagnosis not present

## 2023-06-09 DIAGNOSIS — J302 Other seasonal allergic rhinitis: Secondary | ICD-10-CM | POA: Diagnosis not present

## 2023-06-09 DIAGNOSIS — H539 Unspecified visual disturbance: Secondary | ICD-10-CM | POA: Diagnosis not present

## 2023-06-09 DIAGNOSIS — Z0000 Encounter for general adult medical examination without abnormal findings: Secondary | ICD-10-CM | POA: Diagnosis not present

## 2023-06-09 DIAGNOSIS — E785 Hyperlipidemia, unspecified: Secondary | ICD-10-CM | POA: Diagnosis not present

## 2023-06-09 DIAGNOSIS — R634 Abnormal weight loss: Secondary | ICD-10-CM | POA: Diagnosis not present

## 2023-06-09 DIAGNOSIS — I493 Ventricular premature depolarization: Secondary | ICD-10-CM | POA: Diagnosis not present

## 2023-06-09 DIAGNOSIS — I861 Scrotal varices: Secondary | ICD-10-CM | POA: Diagnosis not present

## 2023-06-09 DIAGNOSIS — E559 Vitamin D deficiency, unspecified: Secondary | ICD-10-CM | POA: Diagnosis not present

## 2023-06-09 DIAGNOSIS — G43109 Migraine with aura, not intractable, without status migrainosus: Secondary | ICD-10-CM | POA: Diagnosis not present

## 2023-06-09 MED ORDER — ROSUVASTATIN CALCIUM 10 MG PO TABS
10.0000 mg | ORAL_TABLET | Freq: Every day | ORAL | 3 refills | Status: DC
  Filled 2023-06-09 (×3): qty 90, 90d supply, fill #0
  Filled 2023-09-03: qty 90, 90d supply, fill #1
  Filled 2023-12-07: qty 90, 90d supply, fill #2
  Filled 2024-02-28: qty 90, 90d supply, fill #3

## 2023-06-25 ENCOUNTER — Other Ambulatory Visit: Payer: Self-pay | Admitting: Internal Medicine

## 2023-06-25 DIAGNOSIS — R600 Localized edema: Secondary | ICD-10-CM

## 2023-07-19 DIAGNOSIS — H539 Unspecified visual disturbance: Secondary | ICD-10-CM | POA: Diagnosis not present

## 2023-07-27 ENCOUNTER — Ambulatory Visit
Admission: RE | Admit: 2023-07-27 | Discharge: 2023-07-27 | Disposition: A | Discharge status: Home / Self Care | Payer: 59 | Source: Ambulatory Visit | Attending: Internal Medicine | Admitting: Internal Medicine

## 2023-07-27 ENCOUNTER — Ambulatory Visit
Admission: RE | Admit: 2023-07-27 | Discharge: 2023-07-27 | Disposition: A | Discharge status: Home / Self Care | Payer: Commercial Managed Care - PPO | Source: Ambulatory Visit | Attending: Internal Medicine | Admitting: Internal Medicine

## 2023-07-27 DIAGNOSIS — R600 Localized edema: Secondary | ICD-10-CM

## 2023-07-27 NOTE — Consult Note (Signed)
Chief Complaint:  Lower extremity swelling, concern for venous insufficiency  Referring Physician(s): Perini,Mark  History of Present Illness: Gary Watts is a 47 y.o. male who presents for venous evaluation.  He reports slow development of lower extremity calf and ankle edema slight worse in the left leg while standing for long procedures during the day.  Review of his venous history performed.  He has had no prior treatments for varicose veins or spider veins.  No history of DVT or ulceration.  Minor family history of varicose veins affecting his mother.  He currently wears 20-30 and 30-40 mmHg calf length compression stockings on most workdays.  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  He currently does not take any pain medications for varicose veins or spider veins.  Elevation does alleviate his symptoms.  His symptoms have improved with the stocking use.  Excellent functional status.  Normal weight.  Routine exercise consist of riding the Peloton bike several times a week.  Past Medical History:  Diagnosis Date   Allergy    Eczema    Eczema    History of chicken pox    Hyperlipidemia    Onychomycosis    Palpitations 2015   PVC (premature ventricular contraction)    Vitamin D deficiency     Past Surgical History:  Procedure Laterality Date   TONSILLECTOMY     WIDSOM TOOTH EXTRACTION      Allergies: Cefaclor  Medications: Prior to Admission medications   Medication Sig Start Date End Date Taking? Authorizing Provider  Cholecalciferol (VITAMIN D) 50 MCG (2000 UT) tablet Take 2,000 Units by mouth daily. 2 tablet per day    [provider]  diltiazem (CARDIZEM CD) 120 MG 24 hr capsule TAKE 1 CAPSULE BY MOUTH ONCE A DAY 04/24/20 06/10/21  Rodrigo Ran, MD  diltiazem (CARDIZEM) 120 MG tablet Take 120 mg by mouth daily.    [provider]  diltiazem  (CARTIA XT) 120 MG 24 hr capsule Take 1 capsule (120 mg total) by mouth daily. 05/15/22     fexofenadine (ALLEGRA) 180 MG tablet Take 180 mg by mouth daily. PRN    [provider]  fluconazole (DIFLUCAN) 150 MG tablet Take 1 tablet (150 mg total) by mouth once a week. 07/31/22     Multiple Vitamin (MULTIVITAMIN) tablet Take 1 tablet by mouth daily. Patient not taking: Reported on 08/21/2022    [provider]  PEG-KCl-NaCl-NaSulf-Na Asc-C (PLENVU) 140 g SOLR Use as directed 07/04/22   Lynann Bologna, MD  rosuvastatin (CRESTOR) 10 MG tablet Take 1 tablet (10 mg total) by mouth daily. 05/20/22     rosuvastatin (CRESTOR) 10 MG tablet Take 1 tablet (10 mg total) by mouth daily. 06/09/23        Family History  Problem Relation Age of Onset   Colon polyps Mother    Healthy Mother    Colon polyps Father    Hypertension Father    Hyperlipidemia Father    Dementia Father    Alzheimer's disease Father    Colon polyps Brother    Kidney Stones Brother    AAA (abdominal aortic aneurysm) Brother    Colon cancer Maternal Grandmother    Cancer Maternal Grandmother    Cancer - Colon Maternal Grandfather    Kidney failure Paternal Grandmother    Kidney failure Paternal Grandfather    Healthy  Son    Healthy Son    Esophageal cancer Neg Hx    Stomach cancer Neg Hx    Rectal cancer Neg Hx     Social History   Socioeconomic History   Marital status: Married    Spouse name: Not on file   Number of children: Not on file   Years of education: Not on file   Highest education level: Not on file  Occupational History   Not on file  Tobacco Use   Smoking status: Never   Smokeless tobacco: Never  Vaping Use   Vaping status: Never Used  Substance and Sexual Activity   Alcohol use: Yes    Comment: socially   Drug use: No   Sexual activity: Yes    Comment: MARRIED (JULIA)  Other Topics Concern   Not on file  Social History Narrative   Not on file   Social Determinants of Health    Financial Resource Strain: Not on file  Food Insecurity: Not on file  Transportation Needs: Not on file  Physical Activity: Not on file  Stress: Not on file  Social Connections: Not on file       Review of Systems: A 12 point ROS discussed and pertinent positives are indicated in the HPI above.  All other systems are negative.  Review of Systems  Vital Signs: There were no vitals taken for this visit.    Physical Exam Constitutional:      General: He is not in acute distress.    Appearance: He is normal weight. He is not ill-appearing.  Musculoskeletal:        General: No swelling, tenderness or deformity.     Right lower leg: No edema.     Left lower leg: No edema.     Comments: No visible significant bulging varicosities or spider veins.  No skin lesions or ulcerations.  No signs of chronic venous stasis in either extremity.  Skin:    Coloration: Skin is not jaundiced.           Imaging: US Venous Img Lower Bilateral (DVT)  Result Date: 07/27/2023 CLINICAL DATA:  Lower extremity edema, concern for early varicose veins with venous insufficiency EXAM: BILATERAL LOWER EXTREMITY VENOUS DOPPLER ULTRASOUND TECHNIQUE: Gray-scale sonography with graded compression, as well as color Doppler and duplex ultrasound were performed to evaluate the lower extremity deep venous systems from the level of the common femoral vein and including the common femoral, femoral, profunda femoral, popliteal and calf veins including the posterior tibial, peroneal and gastrocnemius veins when visible. The superficial great saphenous vein was also interrogated. Spectral Doppler was utilized to evaluate flow at rest and with distal augmentation maneuvers in the common femoral, femoral and popliteal veins. COMPARISON:  None Available. FINDINGS: RIGHT LOWER EXTREMITY Common Femoral Vein: No evidence of thrombus. Normal compressibility, respiratory phasicity and response to augmentation. Saphenofemoral  Junction: No evidence of thrombus. Normal compressibility and flow on color Doppler imaging. Negative for venous insufficiency or reflux. Profunda Femoral Vein: No evidence of thrombus. Normal compressibility and flow on color Doppler imaging. Femoral Vein: No evidence of thrombus. Normal compressibility, respiratory phasicity and response to augmentation. Popliteal Vein: No evidence of thrombus. Normal compressibility, respiratory phasicity and response to augmentation. Calf Veins: No evidence of thrombus. Normal compressibility and flow on color Doppler imaging. Superficial Great Saphenous Vein: No evidence of thrombus. Normal compressibility. Negative for any significant or sustained venous insufficiency or reflux. Right GSV diameter is 5.8 mm or less throughout the  right lower extremity. Small saphenous vein: No evidence of thrombus. Normal compressibility. Negative for venous insufficiency or reflux. 3.8 mm diameter or less. Venous Reflux: No significant or sustained venous insufficiency or reflux in the saphenous system. Other Findings: No right lower extremity significant sub surface branching varicosities. LEFT LOWER EXTREMITY Common Femoral Vein: No evidence of thrombus. Normal compressibility, respiratory phasicity and response to augmentation. Saphenofemoral Junction: No evidence of thrombus. Normal compressibility and flow on color Doppler imaging. Normal diameter measuring 7.5 mm. Only trace reflux. No significant or sustained venous insufficiency. Profunda Femoral Vein: No evidence of thrombus. Normal compressibility and flow on color Doppler imaging. Femoral Vein: No evidence of thrombus. Normal compressibility, respiratory phasicity and response to augmentation. Popliteal Vein: No evidence of thrombus. Normal compressibility, respiratory phasicity and response to augmentation. Prominent diameter of the left popliteal vein measuring 2 cm, nonspecific. Calf Veins: No evidence of thrombus. Normal  compressibility and flow on color Doppler imaging. Superficial Great Saphenous Vein: No evidence of thrombus. Normal compressibility. Trace proximal calf venous insufficiency/reflux for 1.5 seconds. Normal diameter at 3.8 mm. No significant sub surface branching varicosities associated in this region. No significant or sustained venous insufficiency. Small saphenous vein: No evidence of thrombus. Normal compressibility. Normal diameter measuring 3.2 mm or less. Trace reflux noted measuring 0.94 seconds. No associated sub surface branching varicosities. No sustained venous insufficiency. Venous Reflux: Very minimal left GSV venous insufficiency. No significant or sustained reflux or sub surface branching varicosities. IMPRESSION: 1. Negative for right lower extremity DVT or significant venous insufficiency. 2. Negative for left lower extremity DVT. 3. Very minimal left GSV and SSV venous insufficiency. No significant or sustained reflux or associated sub surface branching varicosities. Electronically Signed   By: Judie Petit.  Danaysia Rader M.D.   On: 07/27/2023 10:09   [place] RAD EVAL AND MGMT  Result Date: 07/27/2023 Please refer to "Notes" to see consult details.   Labs:  CBC: No results for input(s): "WBC", "HGB", "HCT", "PLT" in the last 8760 hours.  COAGS: No results for input(s): "INR", "APTT" in the last 8760 hours.  BMP: Recent Labs    10/02/22 0718  NA 139  K 3.9  CL 104  CO2 28  GLUCOSE 92  BUN 23  CALCIUM 9.3  CREATININE 1.16    LIVER FUNCTION TESTS: Recent Labs    10/02/22 0718  BILITOT 0.9  AST 20  ALT 18  ALKPHOS 59  PROT 6.9  ALBUMIN 4.3       Assessment and Plan:  Nonspecific minor lower extremity peripheral calf edema slightly worse than the left.  Ultrasound today demonstrates no evidence of acute or chronic DVT.  Very minimal/trace left GSV and SSV venous insufficiency without significant or sustained reflux or any visualized subsurface branching varicosities.  This degree  of reflux does not warrant any vein intervention including laser treatment, injection sclerotherapy, or vein removal.  Recommend continue conservative management with daily 20 to 30 mmHg calf length compression stockings while at work and continue his routine weekly exercise program.  Certainly if any visible developing varicosities enlarge or develop or his symptoms progress we can reevaluate him on an as-needed basis.  Thank you for this interesting consult.  I greatly enjoyed meeting Leshawn Straka and look forward to participating in their care.  A copy of this report was sent to the requesting provider on this date.  Electronically Signed: Berdine Dance 07/27/2023, 10:23 AM   I spent a total of  40 Minutes   in face to face  in clinical consultation, greater than 50% of which was counseling/coordinating care for This patient with lower extremity edema.

## 2024-03-03 ENCOUNTER — Other Ambulatory Visit: Payer: Self-pay | Admitting: Gastroenterology

## 2024-03-03 ENCOUNTER — Other Ambulatory Visit (HOSPITAL_COMMUNITY): Payer: Self-pay

## 2024-03-03 DIAGNOSIS — T753XXA Motion sickness, initial encounter: Secondary | ICD-10-CM

## 2024-03-03 MED ORDER — ONDANSETRON 4 MG PO TBDP
4.0000 mg | ORAL_TABLET | Freq: Three times a day (TID) | ORAL | 0 refills | Status: AC | PRN
  Filled 2024-03-03: qty 25, 9d supply, fill #0

## 2024-04-18 DIAGNOSIS — M79644 Pain in right finger(s): Secondary | ICD-10-CM | POA: Diagnosis not present

## 2024-05-03 DIAGNOSIS — M67441 Ganglion, right hand: Secondary | ICD-10-CM | POA: Diagnosis not present

## 2024-05-31 ENCOUNTER — Other Ambulatory Visit (HOSPITAL_COMMUNITY): Payer: Self-pay

## 2024-05-31 MED ORDER — ROSUVASTATIN CALCIUM 10 MG PO TABS
10.0000 mg | ORAL_TABLET | Freq: Every day | ORAL | 3 refills | Status: AC
  Filled 2024-05-31: qty 90, 90d supply, fill #0
  Filled 2024-09-01: qty 90, 90d supply, fill #1

## 2024-07-13 ENCOUNTER — Other Ambulatory Visit: Payer: Self-pay | Admitting: Medical Genetics

## 2024-09-01 ENCOUNTER — Other Ambulatory Visit

## 2024-09-01 DIAGNOSIS — Z006 Encounter for examination for normal comparison and control in clinical research program: Secondary | ICD-10-CM

## 2024-09-07 DIAGNOSIS — E559 Vitamin D deficiency, unspecified: Secondary | ICD-10-CM | POA: Diagnosis not present

## 2024-09-07 DIAGNOSIS — R6882 Decreased libido: Secondary | ICD-10-CM | POA: Diagnosis not present

## 2024-09-07 DIAGNOSIS — Z0000 Encounter for general adult medical examination without abnormal findings: Secondary | ICD-10-CM | POA: Diagnosis not present

## 2024-09-07 DIAGNOSIS — G43109 Migraine with aura, not intractable, without status migrainosus: Secondary | ICD-10-CM | POA: Diagnosis not present

## 2024-09-07 DIAGNOSIS — L309 Dermatitis, unspecified: Secondary | ICD-10-CM | POA: Diagnosis not present

## 2024-09-07 DIAGNOSIS — Z1331 Encounter for screening for depression: Secondary | ICD-10-CM | POA: Diagnosis not present

## 2024-09-07 DIAGNOSIS — Z1339 Encounter for screening examination for other mental health and behavioral disorders: Secondary | ICD-10-CM | POA: Diagnosis not present

## 2024-09-07 DIAGNOSIS — E785 Hyperlipidemia, unspecified: Secondary | ICD-10-CM | POA: Diagnosis not present

## 2024-09-07 DIAGNOSIS — R739 Hyperglycemia, unspecified: Secondary | ICD-10-CM | POA: Diagnosis not present

## 2024-09-07 DIAGNOSIS — J302 Other seasonal allergic rhinitis: Secondary | ICD-10-CM | POA: Diagnosis not present

## 2024-09-21 LAB — GENECONNECT MOLECULAR SCREEN: Genetic Analysis Overall Interpretation: NEGATIVE

## 2024-11-03 ENCOUNTER — Ambulatory Visit (INDEPENDENT_AMBULATORY_CARE_PROVIDER_SITE_OTHER): Admitting: Podiatry

## 2024-11-03 ENCOUNTER — Ambulatory Visit

## 2024-11-03 DIAGNOSIS — S90851D Superficial foreign body, right foot, subsequent encounter: Secondary | ICD-10-CM

## 2024-11-03 DIAGNOSIS — L989 Disorder of the skin and subcutaneous tissue, unspecified: Secondary | ICD-10-CM

## 2024-11-03 DIAGNOSIS — B351 Tinea unguium: Secondary | ICD-10-CM | POA: Diagnosis not present

## 2024-11-03 DIAGNOSIS — B070 Plantar wart: Secondary | ICD-10-CM

## 2024-11-03 NOTE — Patient Instructions (Signed)
 Take dressing off in 8 hours and wash the foot with soap and water. If it is hurting or becomes uncomfortable before the 8 hours, go ahead and remove the bandage and wash the area.  If it blisters, apply antibiotic ointment and a band-aid.  Monitor for any signs/symptoms of infection. Call the office immediately if any occur or go directly to the emergency room. Call with any questions/concerns.

## 2024-11-07 NOTE — Progress Notes (Signed)
 Subjective:   Patient ID: Gary Watts, male   DOB: 49 y.o.   MRN: [id number]   HPI Chief Complaint  Patient presents with   Plantar Warts    Patient presents today for plantar warts , possible fungus    49 year old male presents the office the above concerns.  He states he has had a chronic wart on the bottom of his right big toe.  Tried over-the-counter medication.  He also states on the right heel there is a skin lesion which is tender.  He does not recall stepping on any foreign objects or any injuries.  He has also had nail fungus for some time has been on oral medication previously without significant improvement.   Review of Systems  All other systems reviewed and are negative.       Objective:  Physical Exam  General: AAO x3, NAD  Dermatological: The plantar aspect the right hallux is a hyperkeratotic lesion with pinpoint bleeding consistent with verruca.  On the plantar lateral aspect the heel is a hyperkeratotic lesion.  Upon debridement there is no evidence of puncture wound or definitive verruca.  However upon debridement thick callus and possible small foreign body was removed but upon debridement there is no underlying ulceration, drainage or signs of infection.  The nails are also hypertrophic, dystrophic Regal, brown discoloration.  No open lesions.  Vascular: Dorsalis Pedis artery and Posterior Tibial artery pedal pulses are 2/4 bilateral with immedate capillary fill time. There is no pain with calf compression, swelling, warmth, erythema.   Neruologic: Grossly intact via light touch bilateral.   Musculoskeletal: No gross boney pedal deformities bilateral. No pain, crepitus, or limitation noted with foot and ankle range of motion bilateral. Muscular strength 5/5 in all groups tested bilateral.       Assessment/Plan:   Foreign body in right foot, subsequent encounter - X-rays obtained and reviewed.  Skin marker was utilized identify the area of the skin  lesion.  There is no definitive foreign body identified.  No evidence of acute fracture.  Plantar wart -Debride the area that any complications.  Cantharone is applied followed by an occlusive bandage.  Postprocedure dressing discussed.  Monitor for any signs or symptoms of infection  Skin lesion -Debrided the area any complications or bleeding.  No evidence of puncture wound or foreign body identified otherwise there is no signs of infection or open lesions.  Cantharone applied followed by an occlusive bandage.  Postprocedure instructions discussed.  Dermatophytosis of nail -Given history of multiple treatments culture the nails.  She will be debrided the nails and complications of bleeding and sent for culture, pathology.  Return if symptoms worsen or fail to improve.  Donnice JONELLE Fees DPM

## 2024-11-09 ENCOUNTER — Other Ambulatory Visit: Payer: Self-pay | Admitting: Podiatry

## 2024-11-15 ENCOUNTER — Ambulatory Visit: Payer: Self-pay | Admitting: Podiatry

## 2024-12-13 ENCOUNTER — Ambulatory Visit
